# Patient Record
Sex: Female | Born: 1935 | Race: Black or African American | Hispanic: No | Smoking: Former smoker
Health system: Southern US, Community
[De-identification: ages and names within clinical notes are randomized; demographics above are authoritative.]

## PROBLEM LIST (undated history)

## (undated) DIAGNOSIS — M1990 Unspecified osteoarthritis, unspecified site: Secondary | ICD-10-CM

## (undated) DIAGNOSIS — G629 Polyneuropathy, unspecified: Secondary | ICD-10-CM

## (undated) DIAGNOSIS — M7120 Synovial cyst of popliteal space [Baker], unspecified knee: Secondary | ICD-10-CM

## (undated) DIAGNOSIS — I639 Cerebral infarction, unspecified: Secondary | ICD-10-CM

## (undated) DIAGNOSIS — E039 Hypothyroidism, unspecified: Secondary | ICD-10-CM

## (undated) DIAGNOSIS — I10 Essential (primary) hypertension: Secondary | ICD-10-CM

## (undated) DIAGNOSIS — C801 Malignant (primary) neoplasm, unspecified: Secondary | ICD-10-CM

## (undated) DIAGNOSIS — M810 Age-related osteoporosis without current pathological fracture: Secondary | ICD-10-CM

## (undated) DIAGNOSIS — E7800 Pure hypercholesterolemia, unspecified: Secondary | ICD-10-CM

## (undated) DIAGNOSIS — J449 Chronic obstructive pulmonary disease, unspecified: Secondary | ICD-10-CM

## (undated) HISTORY — PX: DILATION AND CURETTAGE OF UTERUS: SHX78

## (undated) HISTORY — DX: Malignant (primary) neoplasm, unspecified: C80.1

## (undated) HISTORY — DX: Hypothyroidism, unspecified: E03.9

## (undated) HISTORY — DX: Age-related osteoporosis without current pathological fracture: M81.0

## (undated) HISTORY — DX: Essential (primary) hypertension: I10

## (undated) HISTORY — DX: Polyneuropathy, unspecified: G62.9

## (undated) HISTORY — DX: Chronic obstructive pulmonary disease, unspecified: J44.9

## (undated) HISTORY — DX: Pure hypercholesterolemia, unspecified: E78.00

## (undated) HISTORY — DX: Synovial cyst of popliteal space (Baker), unspecified knee: M71.20

## (undated) HISTORY — DX: Cerebral infarction, unspecified: I63.9

## (undated) HISTORY — DX: Unspecified osteoarthritis, unspecified site: M19.90

---

## 1997-03-07 DIAGNOSIS — I639 Cerebral infarction, unspecified: Secondary | ICD-10-CM

## 1997-03-07 HISTORY — DX: Cerebral infarction, unspecified: I63.9

## 2004-05-25 ENCOUNTER — Other Ambulatory Visit: Payer: Self-pay

## 2004-05-25 ENCOUNTER — Emergency Department: Payer: Self-pay | Admitting: Emergency Medicine

## 2004-09-23 ENCOUNTER — Emergency Department: Payer: Self-pay | Admitting: Emergency Medicine

## 2005-01-16 ENCOUNTER — Ambulatory Visit: Payer: Self-pay | Admitting: Internal Medicine

## 2007-01-20 ENCOUNTER — Other Ambulatory Visit: Payer: Self-pay

## 2007-01-20 ENCOUNTER — Emergency Department: Payer: Self-pay

## 2007-04-22 ENCOUNTER — Ambulatory Visit: Payer: Self-pay | Admitting: Emergency Medicine

## 2007-11-04 ENCOUNTER — Ambulatory Visit: Payer: Self-pay | Admitting: Internal Medicine

## 2008-04-04 ENCOUNTER — Ambulatory Visit: Payer: Self-pay | Admitting: Family Medicine

## 2009-04-24 ENCOUNTER — Ambulatory Visit: Payer: Self-pay | Admitting: Internal Medicine

## 2009-07-31 ENCOUNTER — Emergency Department: Payer: Self-pay | Admitting: Internal Medicine

## 2010-02-24 ENCOUNTER — Emergency Department: Payer: Self-pay | Admitting: Emergency Medicine

## 2011-05-17 ENCOUNTER — Ambulatory Visit: Payer: Self-pay | Admitting: Ophthalmology

## 2011-05-23 ENCOUNTER — Ambulatory Visit: Payer: Self-pay | Admitting: Ophthalmology

## 2012-07-17 ENCOUNTER — Other Ambulatory Visit: Payer: Self-pay | Admitting: Internal Medicine

## 2012-07-17 NOTE — Telephone Encounter (Signed)
Pt is needing refill on Levothyroxine and she uses [facility] in [place].

## 2012-07-17 NOTE — Telephone Encounter (Signed)
I am ok to refill her levothyroxine - will need to clarify dose with her pharmacy and then ok to call in until her appt with me.

## 2012-07-18 ENCOUNTER — Telehealth: Payer: Self-pay | Admitting: *Deleted

## 2012-07-18 MED ORDER — LEVOTHYROXINE SODIUM 50 MCG PO TABS: 50.0000 ug | ORAL_TABLET | Freq: Every day | ORAL | Status: DC

## 2012-07-18 NOTE — Telephone Encounter (Signed)
Called prescription into pharmacy. Patient notified.

## 2012-07-18 NOTE — Telephone Encounter (Signed)
Patient's date of birth is wrong, the correct date is 03/01/1931.

## 2012-08-16 ENCOUNTER — Other Ambulatory Visit: Payer: Self-pay | Admitting: Internal Medicine

## 2012-08-16 NOTE — Telephone Encounter (Signed)
[facility] is needing [facility]ills [facility]   Triamterene -HCTZ 37.5-25 mg  # 30  Vytorin 10-40 mg  # 30

## 2012-08-21 MED ORDER — EZETIMIBE-SIMVASTATIN 10-40 MG PO TABS: 1.0000 | ORAL_TABLET | Freq: Every day | ORAL | Status: DC

## 2012-08-21 MED ORDER — TRIAMTERENE-HCTZ 37.5-25 MG PO CAPS: 1.0000 | ORAL_CAPSULE | ORAL | Status: DC

## 2012-08-21 NOTE — Telephone Encounter (Signed)
Sent in to pharmacy.  

## 2012-09-17 ENCOUNTER — Encounter: Payer: Self-pay | Admitting: Internal Medicine

## 2012-09-25 ENCOUNTER — Ambulatory Visit (INDEPENDENT_AMBULATORY_CARE_PROVIDER_SITE_OTHER): Payer: Medicaid Other | Admitting: Internal Medicine

## 2012-09-25 ENCOUNTER — Encounter: Payer: Self-pay | Admitting: Internal Medicine

## 2012-09-25 VITALS — BP 130/70 | HR 86 | Temp 97.5°F | Ht 62.0 in | Wt 128.5 lb

## 2012-09-25 DIAGNOSIS — I6350 Cerebral infarction due to unspecified occlusion or stenosis of unspecified cerebral artery: Secondary | ICD-10-CM

## 2012-09-25 DIAGNOSIS — G629 Polyneuropathy, unspecified: Secondary | ICD-10-CM

## 2012-09-25 DIAGNOSIS — R55 Syncope and collapse: Secondary | ICD-10-CM

## 2012-09-25 DIAGNOSIS — E7800 Pure hypercholesterolemia, unspecified: Secondary | ICD-10-CM

## 2012-09-25 DIAGNOSIS — G609 Hereditary and idiopathic neuropathy, unspecified: Secondary | ICD-10-CM

## 2012-09-25 DIAGNOSIS — I639 Cerebral infarction, unspecified: Secondary | ICD-10-CM

## 2012-09-25 DIAGNOSIS — I10 Essential (primary) hypertension: Secondary | ICD-10-CM

## 2012-09-25 LAB — HEPATIC FUNCTION PANEL
ALT: 10 U/L (ref 0–35)
AST: 16 U/L (ref 0–37)
Alkaline Phosphatase: 60 U/L (ref 39–117)
Bilirubin, Direct: 0.1 mg/dL (ref 0.0–0.3)
Total Protein: 6.8 g/dL (ref 6.0–8.3)

## 2012-09-25 LAB — CBC WITH DIFFERENTIAL/PLATELET
Basophils Absolute: 0.1 10*3/uL (ref 0.0–0.1)
Eosinophils Absolute: 0.2 10*3/uL (ref 0.0–0.7)
Hemoglobin: 12.9 g/dL (ref 12.0–15.0)
Lymphocytes Relative: 22.5 % (ref 12.0–46.0)
Lymphs Abs: 2.8 10*3/uL (ref 0.7–4.0)
MCHC: 33.3 g/dL (ref 30.0–36.0)
Monocytes Absolute: 0.8 10*3/uL (ref 0.1–1.0)
Neutro Abs: 8.4 10*3/uL — ABNORMAL HIGH (ref 1.4–7.7)
RDW: 13.6 % (ref 11.5–14.6)

## 2012-09-25 LAB — BASIC METABOLIC PANEL
Calcium: 9.3 mg/dL (ref 8.4–10.5)
Chloride: 105 mEq/L (ref 96–112)
Creatinine, Ser: 0.9 mg/dL (ref 0.4–1.2)
GFR: 81.66 mL/min (ref >60.00)

## 2012-09-25 LAB — LIPID PANEL: Cholesterol: 146 mg/dL (ref 0–200)

## 2012-09-25 LAB — TSH: TSH: 1.16 u[IU]/mL (ref 0.35–5.50)

## 2012-09-26 ENCOUNTER — Other Ambulatory Visit: Payer: Self-pay | Admitting: Internal Medicine

## 2012-09-26 DIAGNOSIS — D72829 Elevated white blood cell count, unspecified: Secondary | ICD-10-CM

## 2012-09-26 NOTE — Progress Notes (Signed)
Order placed for follow up cbc 

## 2012-09-29 ENCOUNTER — Encounter: Payer: Self-pay | Admitting: Internal Medicine

## 2012-09-29 DIAGNOSIS — E7800 Pure hypercholesterolemia, unspecified: Secondary | ICD-10-CM | POA: Insufficient documentation

## 2012-09-29 DIAGNOSIS — I639 Cerebral infarction, unspecified: Secondary | ICD-10-CM | POA: Insufficient documentation

## 2012-09-29 DIAGNOSIS — I10 Essential (primary) hypertension: Secondary | ICD-10-CM | POA: Insufficient documentation

## 2012-09-29 DIAGNOSIS — G629 Polyneuropathy, unspecified: Secondary | ICD-10-CM | POA: Insufficient documentation

## 2012-09-29 DIAGNOSIS — R55 Syncope and collapse: Secondary | ICD-10-CM | POA: Insufficient documentation

## 2012-09-29 NOTE — Assessment & Plan Note (Signed)
She remains on aspirin daily.  Has done well.  Had this episode one month ago.  Appears to have had no focal neurological changes.  Discussed further w/up including scanning and carotid ultrasound.  She declines.  Wants to monitor.  Will be evaluated if reoccurs.  Follow.

## 2012-09-29 NOTE — Assessment & Plan Note (Addendum)
Not taking Gabapentin now.  Did not report as a significant problem today.  Follow.  Consider checking B12 if has not been checked.  Review records.

## 2012-09-29 NOTE — Assessment & Plan Note (Signed)
Blood pressure is doing well on no medication.  Remain off.  Check metabolic panel.  Stop potassium.  Unclear if the episode was related to the medication/blood pressure drop.  Has not occurred since stopping.  Will remain off.  Follow pressures.  Pt desires to hold on any further w/up currently.  Follow.

## 2012-09-29 NOTE — Assessment & Plan Note (Signed)
Had the one episode as outlined.  Has not reoccurred since stopping the medication.  Blood pressure looks good.  Discussed further w/up.  She declines.  Wants to monitor.  Will notify me if she changes her mind.  Will be evaluated if occurs again.

## 2012-09-29 NOTE — Assessment & Plan Note (Signed)
Remains on vytorin.  Has eaten today.  Will check cholesterol with next fasting lab. Check liver function today.

## 2012-09-29 NOTE — Progress Notes (Signed)
Subjective:    Patient ID: Rhonda Morse, female    DOB: July 21, 1931, 79 y.o.   MRN: [id number]  HPI 79 year old female with past history of hypertension, hypercholesterolemia and previous CVA.  She comes in today for a scheduled follow up.  She is accompanied by her daughter Burna Mortimer.  History obtained from both of them.  States about one month ago, she got up out of bed and head felt funny.   Episode was not witnessed.  States she went down on the ground.  No head injury.  Burna Mortimer saw her soon after the episode.  States she was not confused.  Was oriented.  Got up.  Has had no further episodes.  She reports that she got a new blood pressure medication and had just started.  After this episode, she stopped the medication and has had no further dizziness or light headedness.  No syncope or near syncope.  No chest pain or tightness.  No increased sob.  Eating and drinking ok.  Weight stable.  Bowels stable.     Past Medical History  Diagnosis Date  . Hypertension     Remote history of hypertension   . Hypercholesteremia   . CVA (cerebral infarction) 08-98    History of CVA in   . Hypothyroidism   . Osteoarthritis   . COPD (chronic obstructive pulmonary disease)     COPD/fibrosis  . Osteoporosis   . Peripheral neuropathy   . Baker's cyst of knee     Behind the right knee    Current Outpatient Prescriptions on File Prior to Visit  Medication Sig Dispense Refill  . aspirin 81 MG tablet Take 81 mg by mouth daily. 1 tablet by mouth once a day      . ezetimibe-simvastatin (VYTORIN) 10-40 MG per tablet Take 1 tablet by mouth at bedtime.  30 tablet  1  . fexofenadine (ALLEGRA) 60 MG tablet Take 60 mg by mouth daily. Take 1 tablet by mouth twice a day      . levothyroxine (SYNTHROID, LEVOTHROID) 50 MCG tablet Take 1 tablet (50 mcg total) by mouth daily.  90 tablet  0  . potassium chloride (MICRO-K) 10 MEQ CR capsule Take 10 mEq by mouth 2 (two) times daily. Take 1 capsule by mouth once a day       No  current facility-administered medications on file prior to visit.    Review of Systems Patient denies any headache, lightheadedness or dizziness now.  Had the one episode.  Stopped the medication.  Resolved.  No chest pain, tightness or palpitations.  No increased shortness of breath, cough or congestion.  No nausea or vomiting.  No acid reflux.  No abdominal pain or cramping.  No bowel change, such as diarrhea, constipation, BRBPR or melana.  No urine change.        Objective:   Physical Exam Filed Vitals:   09/25/12 1034  BP: 130/70  Pulse: 86  Temp: 97.5 F (36.4 C)   Blood pressure recheck:  124/74, pulse 36  79 year old female in no acute distress.   HEENT:  Nares- clear.  Oropharynx - without lesions. NECK:  Supple.  Nontender.  No audible bruit.  HEART:  Appears to be regular. LUNGS:  No crackles or wheezing audible.  Respirations even and unlabored.  RADIAL PULSE:  Equal bilaterally.   ABDOMEN:  Soft, nontender.  Bowel sounds present and normal.  No audible abdominal bruit.   EXTREMITIES:  No increased edema present.  DP pulses palpable and equal bilaterally.          Assessment & Plan:  CARDIOVASCULAR.  She did have the apparent syncopal episode as outlined.  No chest pain or tightness.  No sob.  Discussed further cardiac w/up.  She declines.  Wants to monitor for now.  Follow.    PREVIOUS WEIGHT LOSS.  Weight is stable.  Follow.

## 2012-10-09 ENCOUNTER — Telehealth: Payer: Self-pay | Admitting: *Deleted

## 2012-10-09 ENCOUNTER — Emergency Department: Payer: Self-pay

## 2012-10-09 ENCOUNTER — Other Ambulatory Visit (INDEPENDENT_AMBULATORY_CARE_PROVIDER_SITE_OTHER): Payer: Medicaid Other

## 2012-10-09 DIAGNOSIS — D72829 Elevated white blood cell count, unspecified: Secondary | ICD-10-CM

## 2012-10-09 LAB — CBC WITH DIFFERENTIAL/PLATELET
Basophils Absolute: 0 10*3/uL (ref 0.0–0.1)
Eosinophils Absolute: 0.1 10*3/uL (ref 0.0–0.7)
Lymphocytes Relative: 11.8 % — ABNORMAL LOW (ref 12.0–46.0)
MCHC: 33.7 g/dL (ref 30.0–36.0)
MCV: 91.5 fl (ref 78.0–100.0)
Monocytes Absolute: 2.2 10*3/uL — ABNORMAL HIGH (ref 0.1–1.0)
Neutrophils Relative %: 73.2 % (ref 43.0–77.0)
RDW: 14.1 % (ref 11.5–14.6)

## 2012-10-09 LAB — COMPREHENSIVE METABOLIC PANEL
Albumin: 3.4 g/dL (ref 3.4–5.0)
Alkaline Phosphatase: 75 U/L (ref 50–136)
Chloride: 104 mmol/L (ref 98–107)
Co2: 29 mmol/L (ref 21–32)
Creatinine: 0.98 mg/dL (ref 0.60–1.30)
Potassium: 3.2 mmol/L — ABNORMAL LOW (ref 3.5–5.1)
SGOT(AST): 20 U/L (ref 15–37)
SGPT (ALT): 14 U/L (ref 12–78)
Sodium: 140 mmol/L (ref 136–145)

## 2012-10-09 LAB — TROPONIN I: Troponin-I: <0.02 ng/mL

## 2012-10-09 LAB — CBC
HCT: 39.9 % (ref 35.0–47.0)
HGB: 13.2 g/dL (ref 12.0–16.0)
MCHC: 33.1 g/dL (ref 32.0–36.0)
MCV: 92 fL (ref 80–100)
Platelet: 353 10*3/uL (ref 150–440)
RBC: 4.34 10*6/uL (ref 3.80–5.20)
RDW: 14.1 % (ref 11.5–14.5)

## 2012-10-09 LAB — LIPASE, BLOOD: Lipase: 129 U/L (ref 73–393)

## 2012-10-09 LAB — URINALYSIS, COMPLETE
Glucose,UR: NEGATIVE mg/dL (ref 0–75)
Nitrite: NEGATIVE
RBC,UR: 55 /HPF (ref 0–5)
Specific Gravity: 1.02 (ref 1.003–1.030)
Squamous Epithelial: 7
WBC UR: 15 /HPF (ref 0–5)

## 2012-10-09 NOTE — Telephone Encounter (Signed)
Patient came in to office today with acute symptoms. Patient was experiencing dizziness, vomiting, uncontrollable bowel movements, coughing up a lot of fleam, and feeling off balance. Patient was advised per Dr. Lorin Picket to go to ER for evaluation and follow-up with [place] after.

## 2012-10-11 LAB — URINE CULTURE

## 2012-10-11 LAB — COMPREHENSIVE METABOLIC PANEL
BUN: 19 mg/dL — ABNORMAL HIGH (ref 7–18)
Co2: 29 mmol/L (ref 21–32)
Creatinine: 1.07 mg/dL (ref 0.60–1.30)
EGFR (African American): 57 — ABNORMAL LOW
EGFR (Non-African Amer.): 49 — ABNORMAL LOW
Osmolality: 277 (ref 275–301)
SGPT (ALT): 27 U/L (ref 12–78)
Sodium: 137 mmol/L (ref 136–145)
Total Protein: 7.9 g/dL (ref 6.4–8.2)

## 2012-10-11 LAB — CK TOTAL AND CKMB (NOT AT ARMC): CK-MB: 29.2 ng/mL — ABNORMAL HIGH (ref 0.5–3.6)

## 2012-10-11 LAB — CBC
HGB: 14.1 g/dL (ref 12.0–16.0)
WBC: 14.9 10*3/uL — ABNORMAL HIGH (ref 3.6–11.0)

## 2012-10-11 LAB — TROPONIN I: Troponin-I: 0.04 ng/mL

## 2012-10-12 ENCOUNTER — Inpatient Hospital Stay: Payer: Self-pay | Admitting: Internal Medicine

## 2012-10-14 ENCOUNTER — Telehealth: Payer: Self-pay | Admitting: *Deleted

## 2012-10-14 MED ORDER — LEVOTHYROXINE SODIUM 50 MCG PO TABS: 50.0000 ug | ORAL_TABLET | Freq: Every day | ORAL | Status: DC

## 2012-10-14 NOTE — Telephone Encounter (Signed)
Returned patient call. Spoke with her daughter and she requested a refill for mother's Synthroid 50 mcg;script filled for 3 months no refills

## 2012-10-15 ENCOUNTER — Telehealth: Payer: Self-pay | Admitting: Internal Medicine

## 2012-10-15 NOTE — Telephone Encounter (Signed)
Patient has hospital follow up set up for 3.17.14 @ 11:15

## 2012-10-16 NOTE — Telephone Encounter (Signed)
Hospital records requested for f/u

## 2012-10-17 LAB — CULTURE, BLOOD (SINGLE)

## 2012-10-21 ENCOUNTER — Ambulatory Visit: Payer: Self-pay | Admitting: Adult Health

## 2012-10-31 ENCOUNTER — Encounter: Payer: Self-pay | Admitting: Adult Health

## 2012-10-31 ENCOUNTER — Ambulatory Visit (INDEPENDENT_AMBULATORY_CARE_PROVIDER_SITE_OTHER): Payer: Medicare Other | Admitting: Adult Health

## 2012-10-31 VITALS — BP 142/87 | HR 98 | Temp 97.9°F | Ht 62.0 in | Wt 126.0 lb

## 2012-10-31 DIAGNOSIS — Z09 Encounter for follow-up examination after completed treatment for conditions other than malignant neoplasm: Secondary | ICD-10-CM

## 2012-10-31 DIAGNOSIS — R634 Abnormal weight loss: Secondary | ICD-10-CM | POA: Insufficient documentation

## 2012-10-31 MED ORDER — EZETIMIBE-SIMVASTATIN 10-40 MG PO TABS: 1.0000 | ORAL_TABLET | Freq: Every day | ORAL | Status: DC

## 2012-10-31 MED ORDER — FLUTICASONE-SALMETEROL 250-50 MCG/DOSE IN AEPB: 1.0000 | INHALATION_SPRAY | Freq: Two times a day (BID) | RESPIRATORY_TRACT | Status: AC

## 2012-10-31 MED ORDER — TIOTROPIUM BROMIDE MONOHYDRATE 18 MCG IN CAPS: 18.0000 ug | ORAL_CAPSULE | Freq: Every day | RESPIRATORY_TRACT | Status: AC

## 2012-10-31 MED ORDER — ALBUTEROL SULFATE HFA 108 (90 BASE) MCG/ACT IN AERS: 2.0000 | INHALATION_SPRAY | Freq: Four times a day (QID) | RESPIRATORY_TRACT | Status: AC

## 2012-10-31 MED ORDER — LEVOTHYROXINE SODIUM 50 MCG PO TABS: 50.0000 ug | ORAL_TABLET | Freq: Every day | ORAL | Status: DC

## 2012-10-31 MED ORDER — POTASSIUM CHLORIDE ER 10 MEQ PO CPCR: 10.0000 meq | ORAL_CAPSULE | Freq: Every day | ORAL | Status: AC

## 2012-10-31 NOTE — Assessment & Plan Note (Signed)
Patient was recently discharged from [facility] on 10/12/12 for COPD exacerbation. Overall, she is doing well. Some fatigue and weakness associated with recent illness. I am checking her potassium and kidney function given that changes were made to her medications during hospitalization. Patient has a followup appointment with her PCP in April. Return to clinic sooner if needed

## 2012-10-31 NOTE — Progress Notes (Signed)
  Subjective:    Patient ID: Rhonda Morse, female    DOB: 17-Oct-1930, 79 y.o.   MRN: [id number]  HPI  Patient is a pleasant 79 year old female with history of CVA, COPD, HTN, hyperlipidemia and hypothyroidism who presents to clinic for hospital follow up. She first presented to [facility] emergency room on 10/09/12 with shortness of breath. She was discharged back home with prednisone taper, antibiotic and inhalers. She returned to the emergency room 2 days later with increasing shortness of breath and hospitalized from 10/11/12 through 10/12/12 for COPD exacerbation. According to hospital discharge summary, patient signed herself out AMA. The hospitalists actually wanted patient to stay another 24 hours.  She is here in clinic today with her daughter. Patient is feeling overall improved. She has been started on Spiriva, Advair, albuterol inhaler. She is also on potassium, levothyroxine, aspirin and Vytorin. Medication changes have been reflected in her chart.   Current Outpatient Prescriptions on File Prior to Visit  Medication Sig Dispense Refill  . aspirin 81 MG tablet Take 81 mg by mouth daily. 1 tablet by mouth once a day      . brimonidine-timolol (COMBIGAN) 0.2-0.5 % ophthalmic solution Place 1 drop into both eyes every 12 (twelve) hours.      . fexofenadine (ALLEGRA) 60 MG tablet Take 60 mg by mouth daily. Take 1 tablet by mouth twice a day       No current facility-administered medications on file prior to visit.    Review of Systems  Constitutional: Positive for fatigue. Negative for fever, chills and appetite change.  Respiratory: Negative for cough, chest tightness, shortness of breath and wheezing.   Cardiovascular: Negative for chest pain and palpitations.  Gastrointestinal: Negative for abdominal distention.  Genitourinary: Negative.   Neurological: Positive for weakness. Negative for dizziness, light-headedness and headaches.       Right sided weakness 2/2 CVA  Psychiatric/Behavioral:  Negative.        Objective:   Physical Exam  Constitutional: She is oriented to person, place, and time. She appears well-developed and well-nourished. No distress.  Cardiovascular: Normal rate, regular rhythm and normal heart sounds.  Exam reveals no gallop.   No murmur heard. Pulmonary/Chest: Effort normal and breath sounds normal. No respiratory distress. She has no wheezes. She has no rales.  Abdominal: Soft. Bowel sounds are normal.  Musculoskeletal: She exhibits no edema and no tenderness.  Right sided weakness. Ambulates with cane. Hx of CVA  Neurological: She is alert and oriented to person, place, and time.  Right sided hemiparesis 2/2 CVA  Skin: Skin is warm and dry.  Psychiatric: She has a normal mood and affect. Her behavior is normal. Thought content normal.          Assessment & Plan:

## 2012-11-01 LAB — COMPREHENSIVE METABOLIC PANEL
AST: 14 U/L (ref 0–37)
Albumin: 3.2 g/dL — ABNORMAL LOW (ref 3.5–5.2)
Alkaline Phosphatase: 61 U/L (ref 39–117)
BUN: 8 mg/dL (ref 6–23)
Potassium: 3.7 mEq/L (ref 3.5–5.1)
Sodium: 137 mEq/L (ref 135–145)
Total Bilirubin: 0.5 mg/dL (ref 0.3–1.2)
Total Protein: 6.5 g/dL (ref 6.0–8.3)

## 2012-11-04 ENCOUNTER — Encounter: Payer: Self-pay | Admitting: Internal Medicine

## 2012-11-04 ENCOUNTER — Encounter: Payer: Self-pay | Admitting: General Practice

## 2012-11-04 ENCOUNTER — Other Ambulatory Visit: Payer: Self-pay | Admitting: Internal Medicine

## 2012-11-06 NOTE — Telephone Encounter (Signed)
Rx sent in to pharmacy. 

## 2012-11-14 NOTE — Telephone Encounter (Signed)
Please close encounter

## 2012-12-24 ENCOUNTER — Encounter: Payer: Self-pay | Admitting: Internal Medicine

## 2012-12-24 ENCOUNTER — Ambulatory Visit (INDEPENDENT_AMBULATORY_CARE_PROVIDER_SITE_OTHER): Payer: Medicare Other | Admitting: Internal Medicine

## 2012-12-24 VITALS — BP 124/72 | HR 66 | Temp 97.8°F | Ht 62.0 in | Wt 113.5 lb

## 2012-12-24 DIAGNOSIS — E7800 Pure hypercholesterolemia, unspecified: Secondary | ICD-10-CM

## 2012-12-24 DIAGNOSIS — I10 Essential (primary) hypertension: Secondary | ICD-10-CM

## 2012-12-24 DIAGNOSIS — R55 Syncope and collapse: Secondary | ICD-10-CM

## 2012-12-24 DIAGNOSIS — I639 Cerebral infarction, unspecified: Secondary | ICD-10-CM

## 2012-12-24 DIAGNOSIS — I6350 Cerebral infarction due to unspecified occlusion or stenosis of unspecified cerebral artery: Secondary | ICD-10-CM

## 2012-12-24 DIAGNOSIS — R634 Abnormal weight loss: Secondary | ICD-10-CM

## 2012-12-24 LAB — CBC WITH DIFFERENTIAL/PLATELET
Basophils Relative: 1.2 % (ref 0.0–3.0)
Eosinophils Relative: 0.8 % (ref 0.0–5.0)
Hemoglobin: 12.1 g/dL (ref 12.0–15.0)
Lymphocytes Relative: 17.5 % (ref 12.0–46.0)
Monocytes Relative: 7.6 % (ref 3.0–12.0)
Neutro Abs: 9.8 10*3/uL — ABNORMAL HIGH (ref 1.4–7.7)
RBC: 4.09 Mil/uL (ref 3.87–5.11)

## 2012-12-24 LAB — HEPATIC FUNCTION PANEL
ALT: 10 U/L (ref 0–35)
AST: 18 U/L (ref 0–37)
Alkaline Phosphatase: 69 U/L (ref 39–117)
Bilirubin, Direct: 0.1 mg/dL (ref 0.0–0.3)
Total Bilirubin: 0.7 mg/dL (ref 0.3–1.2)

## 2012-12-25 ENCOUNTER — Ambulatory Visit: Payer: Medicare Other

## 2012-12-25 DIAGNOSIS — R634 Abnormal weight loss: Secondary | ICD-10-CM

## 2012-12-25 LAB — BASIC METABOLIC PANEL
BUN: 9 mg/dL (ref 6–23)
Calcium: 8.4 mg/dL (ref 8.4–10.5)
GFR: 91.34 mL/min (ref >60.00)
Glucose, Bld: 70 mg/dL (ref 70–99)

## 2012-12-29 ENCOUNTER — Encounter: Payer: Self-pay | Admitting: Internal Medicine

## 2012-12-29 NOTE — Assessment & Plan Note (Signed)
She remains on aspirin daily.  Has done well.  Had this episode as outlined in last note.   Appears to have had no focal neurological changes.  Discussed further w/up including scanning and carotid ultrasound.  She declined.  Wanted to monitor.  Will be evaluated if reoccurs.  Follow.

## 2012-12-29 NOTE — Assessment & Plan Note (Signed)
Blood pressure is doing well on no medication.  Remain off.  Check metabolic panel.  Off potassium.

## 2012-12-29 NOTE — Assessment & Plan Note (Signed)
Remains on vytorin.  Has eaten today.  Will check cholesterol with next fasting lab. Check liver function today.

## 2012-12-29 NOTE — Progress Notes (Signed)
Subjective:    Patient ID: Rhonda Morse, female    DOB: 07-20-1931, 79 y.o.   MRN: [id number]  HPI 79 year old female with past history of hypertension, hypercholesterolemia and previous CVA.  She comes in today for a scheduled follow up.  She is accompanied by her daughter Burna Mortimer.  History obtained from both of them.  Has had some issues with allergies recently.  Runny nose and sneezing. No significant sinus pressure.  No increased cough or congestion.  She did have some previous abdominal pain.  Some loose stool.  No blood.  Still eating some.  Has lost weight.  Some fatigue.  Taking her medication, except for her potassium.  Not taking this and has not been on it for a while.      Past Medical History  Diagnosis Date  . Hypertension     Remote history of hypertension   . Hypercholesteremia   . CVA (cerebral infarction) 08-98    History of CVA in   . Hypothyroidism   . Osteoarthritis   . COPD (chronic obstructive pulmonary disease)     COPD/fibrosis  . Osteoporosis   . Peripheral neuropathy   . Baker's cyst of knee     Behind the right knee    Current Outpatient Prescriptions on File Prior to Visit  Medication Sig Dispense Refill  . albuterol (PROAIR HFA) 108 (90 BASE) MCG/ACT inhaler Inhale 2 puffs into the lungs 4 (four) times daily.  8.5 g  3  . aspirin 81 MG tablet Take 81 mg by mouth daily. 1 tablet by mouth once a day      . brimonidine-timolol (COMBIGAN) 0.2-0.5 % ophthalmic solution Place 1 drop into both eyes every 12 (twelve) hours.      Marland Kitchen ezetimibe-simvastatin (VYTORIN) 10-40 MG per tablet Take 1 tablet by mouth at bedtime.  30 tablet  3  . ezetimibe-simvastatin (VYTORIN) 10-40 MG per tablet Take 1 tablet by mouth at bedtime.  30 tablet  5  . fexofenadine (ALLEGRA) 60 MG tablet Take 60 mg by mouth daily. Take 1 tablet by mouth twice a day      . Fluticasone-Salmeterol (ADVAIR) 250-50 MCG/DOSE AEPB Inhale 1 puff into the lungs 2 (two) times daily.  60 each  2  .  levothyroxine (SYNTHROID, LEVOTHROID) 50 MCG tablet Take 1 tablet (50 mcg total) by mouth daily.  30 tablet  3  . potassium chloride (MICRO-K) 10 MEQ CR capsule Take 1 capsule (10 mEq total) by mouth daily. Take 1 capsule by mouth once a day  30 capsule  3  . tiotropium (SPIRIVA) 18 MCG inhalation capsule Place 1 capsule (18 mcg total) into inhaler and inhale daily.  30 capsule  3   No current facility-administered medications on file prior to visit.    Review of Systems Patient denies any headache, lightheadedness or dizziness now.  Had the one episode as outlined in previous note.  Stopped the medication.  Resolved.  Has not had any reoccurrence.  No chest pain, tightness or palpitations.  No increased shortness of breath, cough or congestion.  No nausea or vomiting.  No acid reflux.  Some intermittent abdominal pain.  Some loose stool.  No BRBPR or melana.  No urine change.   Weight loss.      Objective:   Physical Exam  Filed Vitals:   12/24/12 1017  BP: 124/72  Pulse: 66  Temp: 97.8 F (36.6 C)   Pulse 60  79 year old female in no acute  distress.   HEENT:  Nares- clear.  Oropharynx - without lesions. NECK:  Supple.  Nontender.  No audible bruit.  HEART:  Appears to be regular. LUNGS:  No crackles or wheezing audible.  Respirations even and unlabored.  RADIAL PULSE:  Equal bilaterally.   ABDOMEN:  Soft.  No significant tenderness to palpation.   Bowel sounds present and normal.  No audible abdominal bruit.   EXTREMITIES:  No increased edema present.  DP pulses palpable and equal bilaterally.          Assessment & Plan:  CARDIOVASCULAR.  She did have the apparent syncopal episode as outlined in last note.  No chest pain or tightness.  No sob.  Discussed further cardiac w/up.  She declined.  Wanted to monitor. Follow.  Has not had any reoccurrence.

## 2012-12-29 NOTE — Assessment & Plan Note (Signed)
Intermittent abdominal discomfort.  Bowel change.  Weight loss.  Check cbc, met c and tsh.  Consider CT scan to further evaluate.  Follow.  Get her back in soon to reassess.

## 2012-12-29 NOTE — Assessment & Plan Note (Signed)
Had the one episode as outlined in last note.  Has not reoccurred since stopping the medication.  Blood pressure looks good.  Discussed further w/up.  She declined.  Wanted to monitor.  Will notify me if she changes her mind.  Will be evaluated if occurs again.

## 2012-12-30 ENCOUNTER — Telehealth: Payer: Self-pay | Admitting: Internal Medicine

## 2012-12-30 DIAGNOSIS — R634 Abnormal weight loss: Secondary | ICD-10-CM

## 2012-12-30 DIAGNOSIS — D72829 Elevated white blood cell count, unspecified: Secondary | ICD-10-CM

## 2012-12-30 DIAGNOSIS — R630 Anorexia: Secondary | ICD-10-CM

## 2012-12-30 NOTE — Telephone Encounter (Signed)
Pts daughter Burna Mortimer notified of lab results and need for f/u regarding the wbc count.  Also given the persistent weight loss, will schedule CT  abdomen and pelvis.  Order placed.  Had chest CT her 3/14 hospital stay.

## 2013-01-07 ENCOUNTER — Ambulatory Visit (INDEPENDENT_AMBULATORY_CARE_PROVIDER_SITE_OTHER): Payer: Medicare Other | Admitting: Internal Medicine

## 2013-01-07 ENCOUNTER — Other Ambulatory Visit: Payer: Self-pay | Admitting: *Deleted

## 2013-01-07 ENCOUNTER — Encounter: Payer: Self-pay | Admitting: Internal Medicine

## 2013-01-07 VITALS — BP 110/80 | HR 86 | Temp 97.7°F | Ht 62.0 in | Wt 111.0 lb

## 2013-01-07 DIAGNOSIS — R634 Abnormal weight loss: Secondary | ICD-10-CM

## 2013-01-07 DIAGNOSIS — I10 Essential (primary) hypertension: Secondary | ICD-10-CM

## 2013-01-07 DIAGNOSIS — G629 Polyneuropathy, unspecified: Secondary | ICD-10-CM

## 2013-01-07 DIAGNOSIS — G609 Hereditary and idiopathic neuropathy, unspecified: Secondary | ICD-10-CM

## 2013-01-07 DIAGNOSIS — R55 Syncope and collapse: Secondary | ICD-10-CM

## 2013-01-07 DIAGNOSIS — I639 Cerebral infarction, unspecified: Secondary | ICD-10-CM

## 2013-01-07 DIAGNOSIS — I6350 Cerebral infarction due to unspecified occlusion or stenosis of unspecified cerebral artery: Secondary | ICD-10-CM

## 2013-01-07 MED ORDER — FEXOFENADINE HCL 60 MG PO TABS: 60.0000 mg | ORAL_TABLET | Freq: Every day | ORAL | Status: AC

## 2013-01-09 ENCOUNTER — Encounter: Payer: Self-pay | Admitting: Internal Medicine

## 2013-01-09 ENCOUNTER — Telehealth: Payer: Self-pay | Admitting: *Deleted

## 2013-01-09 NOTE — Assessment & Plan Note (Signed)
Had the one episode as outlined in a previous note.  Has not reoccurred since stopping the medication.  Blood pressure looks good.  Discussed further w/up.  She declined.  Wanted to monitor.  Will notify me if she changes her mind.  Will be evaluated if occurs again.

## 2013-01-09 NOTE — Progress Notes (Signed)
Subjective:    Patient ID: Rhonda Morse, female    DOB: January 11, 1931, 79 y.o.   MRN: [id number]  HPI 79 year old female with past history of hypertension, hypercholesterolemia and previous CVA.  She comes in today for a scheduled follow up.  She is accompanied by her daughter Burna Mortimer.  History obtained from both of them. No increased cough or congestion. Breathing stable.  She does report continue decreased appetite.  Some nausea.  Some abdominal pain.  Continued weight loss.  Some depression related to not eating.  No vomiting.     Past Medical History  Diagnosis Date  . Hypertension     Remote history of hypertension   . Hypercholesteremia   . CVA (cerebral infarction) 08-98    History of CVA in   . Hypothyroidism   . Osteoarthritis   . COPD (chronic obstructive pulmonary disease)     COPD/fibrosis  . Osteoporosis   . Peripheral neuropathy   . Baker's cyst of knee     Behind the right knee    Current Outpatient Prescriptions on File Prior to Visit  Medication Sig Dispense Refill  . albuterol (PROAIR HFA) 108 (90 BASE) MCG/ACT inhaler Inhale 2 puffs into the lungs 4 (four) times daily.  8.5 g  3  . aspirin 81 MG tablet Take 81 mg by mouth daily. 1 tablet by mouth once a day      . brimonidine-timolol (COMBIGAN) 0.2-0.5 % ophthalmic solution Place 1 drop into both eyes every 12 (twelve) hours.      Marland Kitchen ezetimibe-simvastatin (VYTORIN) 10-40 MG per tablet Take 1 tablet by mouth at bedtime.  30 tablet  5  . Fluticasone-Salmeterol (ADVAIR) 250-50 MCG/DOSE AEPB Inhale 1 puff into the lungs 2 (two) times daily.  60 each  2  . levothyroxine (SYNTHROID, LEVOTHROID) 50 MCG tablet Take 1 tablet (50 mcg total) by mouth daily.  30 tablet  3  . potassium chloride (MICRO-K) 10 MEQ CR capsule Take 1 capsule (10 mEq total) by mouth daily. Take 1 capsule by mouth once a day  30 capsule  3  . tiotropium (SPIRIVA) 18 MCG inhalation capsule Place 1 capsule (18 mcg total) into inhaler and inhale daily.  30  capsule  3   No current facility-administered medications on file prior to visit.    Review of Systems Patient denies any headache, lightheadedness or dizziness now.  Had the one episode as outlined in previous note.  Stopped the medication.  Resolved.  Has not had any reoccurrence.  No chest pain, tightness or palpitations.  No increased shortness of breath, cough or congestion.  Some decreased appetite and nausea.  No vomiting.  No acid reflux.  Some intermittent abdominal pain.  No BRBPR or melana.  No urine change.   Weight loss.      Objective:   Physical Exam  Filed Vitals:   01/07/13 1540  BP: 110/80  Pulse: 86  Temp: 97.7 F (36.5 C)   Pulse 1  79 year old female in no acute distress.   HEENT:  Nares- clear.  Oropharynx - without lesions. NECK:  Supple.  Nontender.  No audible bruit.  HEART:  Appears to be regular. LUNGS:  No crackles or wheezing audible.  Respirations even and unlabored.  RADIAL PULSE:  Equal bilaterally.   ABDOMEN:  Soft.  Tenderness to palpation over the lower abdomen and pelvic region.   Bowel sounds present and normal.  No audible abdominal bruit.  Increased fullness (questionable mass) noted on  palpation of her lower abdomen and pelvis.  EXTREMITIES:  No increased edema present.  DP pulses palpable and equal bilaterally.          Assessment & Plan:  CARDIOVASCULAR.  She did have the apparent syncopal episode as outlined in the previous note.  No chest pain or tightness.  No sob.  Discussed further cardiac w/up.  She declined.  Wanted to monitor.  Follow.  Has not had any reoccurrence.

## 2013-01-09 NOTE — Assessment & Plan Note (Signed)
Blood pressure is doing well on no medication.  Remain off.  Off potassium.

## 2013-01-09 NOTE — Assessment & Plan Note (Signed)
Continues.  Continues to have decreased appetite and nausea.  Exam with what appears to be abdominal fullness and tenderness.  Questionable mass.  Have ordered CT abdomen and pelvis.  Insurance initially denies.  Have appealed.  Waiting for their decision regarding my appeal.  Encourage increased po intake.  Further w/up pending results of scan.

## 2013-01-09 NOTE — Assessment & Plan Note (Signed)
She remains on aspirin daily.  Has done well.  Unsure of what the episode was this weekend with her vision.  Her daughter states she has not been using her drops as she should.  No other neurological deficits.  Pt declines any change in vision.  Has cataract.  Continue daily aspirin.  Will follow.

## 2013-01-09 NOTE — Assessment & Plan Note (Signed)
Not taking Gabapentin now.  Did not report as a significant problem today.  Follow.  Consider checking B12 if has not been checked.  Review records.

## 2013-01-09 NOTE — Telephone Encounter (Signed)
Received a call from a Dr. Broadus John (stated that he spoke with Dr. Lorin Picket last night) with an auth# for Rhonda Morse- [phone]. Amber aware

## 2013-01-13 ENCOUNTER — Telehealth: Payer: Self-pay | Admitting: Internal Medicine

## 2013-01-13 NOTE — Telephone Encounter (Signed)
Doesn't she need to pick up the prep at the hospital.  Can you let Burna Mortimer know.  Thanks.

## 2013-01-13 NOTE — Telephone Encounter (Signed)
Rhonda Morse is aware of the process. She came by the office. She had left me a VM on Friday and I wasn't here, everything is a go for tomorrow!

## 2013-01-13 NOTE — Telephone Encounter (Signed)
Patient daughter Rhonda Morse called left a message stating she would like to know if Dr. Lorin Picket called in the prep for her procedure scheduled for tomorrow. If she needs to pick that up from the pharmacy, please call her and let her know.

## 2013-01-13 NOTE — Telephone Encounter (Signed)
Thank you :)

## 2013-01-14 ENCOUNTER — Encounter: Payer: Self-pay | Admitting: Internal Medicine

## 2013-01-14 ENCOUNTER — Other Ambulatory Visit: Payer: Self-pay | Admitting: Internal Medicine

## 2013-01-14 ENCOUNTER — Other Ambulatory Visit: Payer: Self-pay | Admitting: *Deleted

## 2013-01-14 ENCOUNTER — Ambulatory Visit: Payer: Self-pay | Admitting: Internal Medicine

## 2013-01-14 MED ORDER — LEVOTHYROXINE SODIUM 50 MCG PO TABS: 50.0000 ug | ORAL_TABLET | Freq: Every day | ORAL | Status: AC

## 2013-01-20 ENCOUNTER — Ambulatory Visit: Payer: Self-pay | Admitting: Oncology

## 2013-01-21 ENCOUNTER — Telehealth: Payer: Self-pay | Admitting: Internal Medicine

## 2013-01-21 ENCOUNTER — Ambulatory Visit: Payer: Self-pay | Admitting: Oncology

## 2013-01-21 LAB — COMPREHENSIVE METABOLIC PANEL
Albumin: 2.2 g/dL — ABNORMAL LOW (ref 3.4–5.0)
Alkaline Phosphatase: 95 U/L (ref 50–136)
Calcium, Total: 8.6 mg/dL (ref 8.5–10.1)
Chloride: 104 mmol/L (ref 98–107)
Co2: 30 mmol/L (ref 21–32)
Creatinine: 0.94 mg/dL (ref 0.60–1.30)
EGFR (African American): >60
EGFR (Non-African Amer.): 57 — ABNORMAL LOW
Glucose: 76 mg/dL (ref 65–99)
Osmolality: 276 (ref 275–301)
Potassium: 3 mmol/L — ABNORMAL LOW (ref 3.5–5.1)
SGOT(AST): 20 U/L (ref 15–37)
Sodium: 139 mmol/L (ref 136–145)
Total Protein: 6.3 g/dL — ABNORMAL LOW (ref 6.4–8.2)

## 2013-01-21 LAB — CBC CANCER CENTER
Basophil #: 0.2 x10 3/mm — ABNORMAL HIGH (ref 0.0–0.1)
Basophil %: 1.4 %
Eosinophil #: 0.1 x10 3/mm (ref 0.0–0.7)
HCT: 35.6 % (ref 35.0–47.0)
HGB: 11.8 g/dL — ABNORMAL LOW (ref 12.0–16.0)
Lymphocyte #: 2.6 x10 3/mm (ref 1.0–3.6)
Lymphocyte %: 23.3 %
MCH: 29.4 pg (ref 26.0–34.0)
MCHC: 33.1 g/dL (ref 32.0–36.0)
MCV: 89 fL (ref 80–100)
Monocyte #: 1 x10 3/mm — ABNORMAL HIGH (ref 0.2–0.9)
Neutrophil #: 7.2 x10 3/mm — ABNORMAL HIGH (ref 1.4–6.5)
Platelet: 474 x10 3/mm — ABNORMAL HIGH (ref 150–440)
RDW: 17.8 % — ABNORMAL HIGH (ref 11.5–14.5)
WBC: 11 x10 3/mm (ref 3.6–11.0)

## 2013-01-21 NOTE — Telephone Encounter (Signed)
Patient needing medical clearance for a laparoscopy (Dr. Purvis Sheffield) [facility]. Needing an appointment slot please advise.

## 2013-01-21 NOTE — Telephone Encounter (Signed)
See if she can come in at 12:00 on Wednesday (01/29/13).   I will just have to work her in there.  Thanks.

## 2013-01-22 LAB — CEA: CEA: 2.5 ng/mL (ref 0.0–4.7)

## 2013-01-22 NOTE — Telephone Encounter (Signed)
appt already scheduled-I tried to reach patient on home number & her daughter at both numbers. No answer or voicemail at either number. Wanted to be sure she knew about the appt

## 2013-01-27 DIAGNOSIS — I369 Nonrheumatic tricuspid valve disorder, unspecified: Secondary | ICD-10-CM

## 2013-01-29 ENCOUNTER — Ambulatory Visit (INDEPENDENT_AMBULATORY_CARE_PROVIDER_SITE_OTHER): Payer: Medicare Other | Admitting: Internal Medicine

## 2013-01-29 ENCOUNTER — Encounter: Payer: Self-pay | Admitting: Internal Medicine

## 2013-01-29 VITALS — BP 110/70 | HR 76 | Temp 97.7°F | Ht 62.0 in | Wt 111.0 lb

## 2013-01-29 DIAGNOSIS — Z0000 Encounter for general adult medical examination without abnormal findings: Secondary | ICD-10-CM

## 2013-01-29 DIAGNOSIS — I10 Essential (primary) hypertension: Secondary | ICD-10-CM

## 2013-01-29 DIAGNOSIS — I639 Cerebral infarction, unspecified: Secondary | ICD-10-CM

## 2013-01-29 DIAGNOSIS — I6350 Cerebral infarction due to unspecified occlusion or stenosis of unspecified cerebral artery: Secondary | ICD-10-CM

## 2013-01-29 DIAGNOSIS — R55 Syncope and collapse: Secondary | ICD-10-CM

## 2013-01-29 DIAGNOSIS — R1900 Intra-abdominal and pelvic swelling, mass and lump, unspecified site: Secondary | ICD-10-CM

## 2013-02-02 ENCOUNTER — Encounter: Payer: Self-pay | Admitting: Internal Medicine

## 2013-02-02 ENCOUNTER — Telehealth: Payer: Self-pay | Admitting: Internal Medicine

## 2013-02-02 DIAGNOSIS — R1900 Intra-abdominal and pelvic swelling, mass and lump, unspecified site: Secondary | ICD-10-CM | POA: Insufficient documentation

## 2013-02-02 NOTE — Assessment & Plan Note (Signed)
Had the one episode as outlined in a previous note.  Has not reoccurred since stopping the medication.  Again, will need close intra op and post op monitoring of her heart rate and blood pressure to avoid extremes.

## 2013-02-02 NOTE — Assessment & Plan Note (Signed)
Blood pressure is doing well on no medication.  Remain off.  Off potassium.

## 2013-02-02 NOTE — Progress Notes (Signed)
Subjective:    Patient ID: Rhonda Morse, female    DOB: Jan 22, 1931, 79 y.o.   MRN: [id number]  HPI 79 year old female with past history of hypertension, hypercholesterolemia and CVA who comes in today as a work in for a pre op evaluation.  Was recently found to have a large pelvic mass.  CT revealed the large mass - uncertain of origin.  Saw Dr Hyacinth Meeker.  Is planning to have surgery.  Here today for pre op evaluation.  She is accompanied by her daughter.  History obtained from both of them.  She is still not eating well.  Did eat better yesterday.  Has lost weight.  No vomiting.  Has had some intermittent abdominal pain and fullness.  Bowels stable.  Denies any chest pain or tightness.  No increased sob.  Has noticed some increased lower extremity swelling.  Has been sitting more with legs hanging down.  Daughter reports in the am, legs are much better.  No increased redness.     Past Medical History  Diagnosis Date  . Hypertension     Remote history of hypertension   . Hypercholesteremia   . CVA (cerebral infarction) 08-98    History of CVA in   . Hypothyroidism   . Osteoarthritis   . COPD (chronic obstructive pulmonary disease)     COPD/fibrosis  . Osteoporosis   . Peripheral neuropathy   . Baker's cyst of knee     Behind the right knee    Current Outpatient Prescriptions on File Prior to Visit  Medication Sig Dispense Refill  . albuterol (PROAIR HFA) 108 (90 BASE) MCG/ACT inhaler Inhale 2 puffs into the lungs 4 (four) times daily.  8.5 g  3  . aspirin 81 MG tablet Take 81 mg by mouth daily. 1 tablet by mouth once a day      . brimonidine-timolol (COMBIGAN) 0.2-0.5 % ophthalmic solution Place 1 drop into both eyes every 12 (twelve) hours.      Marland Kitchen ezetimibe-simvastatin (VYTORIN) 10-40 MG per tablet Take 1 tablet by mouth at bedtime.  30 tablet  5  . fexofenadine (ALLEGRA) 60 MG tablet Take 1 tablet (60 mg total) by mouth daily. Take 1 tablet by mouth twice a day  30 tablet  11  .  Fluticasone-Salmeterol (ADVAIR) 250-50 MCG/DOSE AEPB Inhale 1 puff into the lungs 2 (two) times daily.  60 each  2  . levothyroxine (SYNTHROID, LEVOTHROID) 50 MCG tablet Take 1 tablet (50 mcg total) by mouth daily.  30 tablet  10  . potassium chloride (MICRO-K) 10 MEQ CR capsule Take 1 capsule (10 mEq total) by mouth daily. Take 1 capsule by mouth once a day  30 capsule  3  . tiotropium (SPIRIVA) 18 MCG inhalation capsule Place 1 capsule (18 mcg total) into inhaler and inhale daily.  30 capsule  3   No current facility-administered medications on file prior to visit.    Review of Systems Patient denies any headache, lightheadedness or dizziness.  No sinus or allergy symptoms.  No chest pain, tightness or palpitations.  No increased shortness of breath, cough or congestion.  No nausea or vomiting currently.  Some decreased appetite.  Some intermittent abdominal discomfort.  No bowel change, such as diarrhea, constipation, BRBPR or melana.  No urine change.  Has lost weight.  Lower extremity swelling as outlined.         Objective:   Physical Exam Filed Vitals:   01/29/13 1222  BP: 110/70  Pulse: 76  Temp: 97.7 F (2.5 C)   79 year old female in no acute distress.   HEENT:  Nares- clear.  Oropharynx - without lesions. NECK:  Supple.  Nontender.  No audible bruit.  HEART:  Appears to be regular. LUNGS:  No crackles or wheezing audible.  Respirations even and unlabored.  RADIAL PULSE:  Equal bilaterally.  ABDOMEN:  Soft.  No significant tenderness to palpation.  Increased fullness lower abdomen.  Bowel sounds present and normal.    EXTREMITIES:  Bilateral pedal and lower extremity edema present.  No increased erythema.       Assessment & Plan:  PRE OP EVALUATION.  EKG obtained and revealed SR with no acute ischemic changes.  I discussed with her and her daughter today regarding the need for ECHO.  Apparently they were evaluated by cardiology recently and just had an ECHO.  Will need to  obtain results.  She will need close intra op and post op monitoring of her heart rate and blood pressure to avoid extremes.  Further cardiac w/up pending cardiology's assessment and review of ECHO.  Discussed at length with the pt and her daughter today.  Discussed surgery and risk of surgery.  Pt ready to proceed with surgery.  Again will need to obtain ECHO results and further information from cardiology.

## 2013-02-02 NOTE — Telephone Encounter (Signed)
Pts daughter reported that Ms Juncaj had an ECHO at the hospital last week.  Need results.

## 2013-02-02 NOTE — Assessment & Plan Note (Signed)
She remains on aspirin daily.  Continue daily aspirin.  Will follow.

## 2013-02-02 NOTE — Assessment & Plan Note (Signed)
Recently found to have abdominal/pelvic mass on CT.  Saw Dr Hyacinth Meeker.  Planning for surgery.  Discussed at length with the patient and her daughter today.  Pt desires to proceed with surgery.

## 2013-02-03 NOTE — Telephone Encounter (Signed)
Requested report from [facility]

## 2013-02-03 NOTE — Telephone Encounter (Signed)
Noted.  Please hold until we receive.

## 2013-02-04 ENCOUNTER — Ambulatory Visit: Payer: Self-pay | Admitting: Oncology

## 2013-02-05 NOTE — Telephone Encounter (Signed)
Just checking to see if you have received her records

## 2013-02-05 NOTE — Telephone Encounter (Signed)
I did get her echo.  It was in my folder. Thanks.

## 2013-02-06 ENCOUNTER — Encounter: Payer: Self-pay | Admitting: *Deleted

## 2013-02-12 ENCOUNTER — Emergency Department: Payer: Self-pay | Admitting: Unknown Physician Specialty

## 2013-02-12 LAB — COMPREHENSIVE METABOLIC PANEL
Albumin: 1.8 g/dL — ABNORMAL LOW (ref 3.4–5.0)
Alkaline Phosphatase: 141 U/L — ABNORMAL HIGH (ref 50–136)
Anion Gap: 8 (ref 7–16)
Bilirubin,Total: 0.6 mg/dL (ref 0.2–1.0)
Chloride: 108 mmol/L — ABNORMAL HIGH (ref 98–107)
Co2: 26 mmol/L (ref 21–32)
Creatinine: 1 mg/dL (ref 0.60–1.30)
EGFR (Non-African Amer.): 53 — ABNORMAL LOW
Osmolality: 286 (ref 275–301)
Potassium: 3.5 mmol/L (ref 3.5–5.1)
SGOT(AST): 27 U/L (ref 15–37)
Sodium: 142 mmol/L (ref 136–145)

## 2013-02-12 LAB — LIPASE, BLOOD: Lipase: 77 U/L (ref 73–393)

## 2013-02-12 LAB — CBC
HCT: 37.2 % (ref 35.0–47.0)
HGB: 12 g/dL (ref 12.0–16.0)
MCH: 28.9 pg (ref 26.0–34.0)
MCHC: 32.4 g/dL (ref 32.0–36.0)
RDW: 18.2 % — ABNORMAL HIGH (ref 11.5–14.5)
WBC: 14.7 10*3/uL — ABNORMAL HIGH (ref 3.6–11.0)

## 2013-02-12 LAB — TROPONIN I: Troponin-I: <0.02 ng/mL

## 2013-02-12 LAB — TSH: Thyroid Stimulating Horm: 6.77 u[IU]/mL — ABNORMAL HIGH

## 2013-02-19 ENCOUNTER — Ambulatory Visit: Payer: Self-pay | Admitting: Gynecologic Oncology

## 2013-02-19 LAB — BASIC METABOLIC PANEL
Anion Gap: 7 (ref 7–16)
Calcium, Total: 8.2 mg/dL — ABNORMAL LOW (ref 8.5–10.1)
Chloride: 107 mmol/L (ref 98–107)
EGFR (African American): >60
Glucose: 92 mg/dL (ref 65–99)
Osmolality: 284 (ref 275–301)
Sodium: 143 mmol/L (ref 136–145)

## 2013-02-19 LAB — CBC
HGB: 12.5 g/dL (ref 12.0–16.0)
MCH: 30 pg (ref 26.0–34.0)
MCHC: 33.2 g/dL (ref 32.0–36.0)
MCV: 90 fL (ref 80–100)
RBC: 4.18 10*6/uL (ref 3.80–5.20)

## 2013-02-20 ENCOUNTER — Telehealth: Payer: Self-pay | Admitting: *Deleted

## 2013-02-20 NOTE — Telephone Encounter (Signed)
[employer].  Left message.  Waiting for call back.

## 2013-02-20 NOTE — Telephone Encounter (Signed)
Cordelia Pen called stating that the patient is scheduled for surgery in a few days. Your last note stated that you were going to get her ECHO & evaluate her cardiology history. Need information faxed to: [phone].

## 2013-02-21 NOTE — Telephone Encounter (Signed)
Talked with Cordelia Pen.  She will talk with Herbert Seta and see if any thing more is needed.

## 2013-02-24 ENCOUNTER — Encounter: Payer: Self-pay | Admitting: General Surgery

## 2013-02-24 ENCOUNTER — Ambulatory Visit (INDEPENDENT_AMBULATORY_CARE_PROVIDER_SITE_OTHER): Payer: PRIVATE HEALTH INSURANCE | Admitting: General Surgery

## 2013-02-24 ENCOUNTER — Telehealth: Payer: Self-pay | Admitting: Internal Medicine

## 2013-02-24 VITALS — BP 108/70 | HR 68 | Resp 14 | Ht 62.0 in | Wt 113.0 lb

## 2013-02-24 DIAGNOSIS — R1900 Intra-abdominal and pelvic swelling, mass and lump, unspecified site: Secondary | ICD-10-CM

## 2013-02-24 DIAGNOSIS — K439 Ventral hernia without obstruction or gangrene: Secondary | ICD-10-CM

## 2013-02-24 NOTE — Telephone Encounter (Signed)
Called.  Discussed pre op evaluation with her.

## 2013-02-24 NOTE — Telephone Encounter (Signed)
Please contact Heather Dr. Rondel Baton nurse about pt. Please call [phone].

## 2013-02-24 NOTE — Progress Notes (Signed)
Patient ID: Rhonda Morse, female   DOB: 29-Aug-1930, 79 y.o.   MRN: [id number]  No chief complaint on file.   HPI Rhonda Morse is a 79 y.o. female here for pre op exam. Patient has large mass in mid lower abdomen. Patient states this area is painful to palpation. She is scheduled for surgery to possibly remove this mass. She states that she has stopped taking her aspirin as per pre op instructions.  HPIHer CA 125 was > 900. Dr. Wendy Morse requested my assistance at surgery. Pt reports not feeling well for a few mos, poor appetite. Has lost about 10 lbs weight.  Past Medical History  Diagnosis Date  . Hypertension     Remote history of hypertension   . Hypercholesteremia   . CVA (cerebral infarction) 08-98    History of CVA in   . Hypothyroidism   . Osteoarthritis   . COPD (chronic obstructive pulmonary disease)     COPD/fibrosis  . Osteoporosis   . Peripheral neuropathy   . Baker's cyst of knee     Behind the right knee  . Stroke 15-20 yrs ago    right side  . Cancer     ovarian    Past Surgical History  Procedure Laterality Date  . Dilation and curettage of uterus      Family History  Problem Relation Age of Onset  . Aneurysm Mother   . Heart disease Brother     MI  . Cancer Father     Social History History  Substance Use Topics  . Smoking status: Former Smoker    Quit date: 08/07/1989  . Smokeless tobacco: Never Used  . Alcohol Use: No    Allergies  Allergen Reactions  . Penicillin V Potassium Rash    Current Outpatient Prescriptions  Medication Sig Dispense Refill  . aspirin 81 MG tablet Take 81 mg by mouth daily. 1 tablet by mouth once a day      . brimonidine-timolol (COMBIGAN) 0.2-0.5 % ophthalmic solution Place 1 drop into both eyes every 12 (twelve) hours.      Marland Kitchen ezetimibe-simvastatin (VYTORIN) 10-40 MG per tablet Take 1 tablet by mouth at bedtime.  30 tablet  5  . fexofenadine (ALLEGRA) 60 MG tablet Take 1 tablet (60 mg total) by mouth daily.  Take 1 tablet by mouth twice a day  30 tablet  11  . levothyroxine (SYNTHROID, LEVOTHROID) 50 MCG tablet Take 1 tablet (50 mcg total) by mouth daily.  30 tablet  10  . albuterol (PROAIR HFA) 108 (90 BASE) MCG/ACT inhaler Inhale 2 puffs into the lungs 4 (four) times daily.  8.5 g  3  . Fluticasone-Salmeterol (ADVAIR) 250-50 MCG/DOSE AEPB Inhale 1 puff into the lungs 2 (two) times daily.  60 each  2  . potassium chloride (MICRO-K) 10 MEQ CR capsule Take 1 capsule (10 mEq total) by mouth daily. Take 1 capsule by mouth once a day  30 capsule  3  . tiotropium (SPIRIVA) 18 MCG inhalation capsule Place 1 capsule (18 mcg total) into inhaler and inhale daily.  30 capsule  3   No current facility-administered medications for this visit.    Review of Systems Review of Systems  Constitutional: Positive for appetite change and fatigue.  Respiratory: Negative for shortness of breath.   Cardiovascular: Negative for chest pain.  Gastrointestinal: Positive for abdominal pain and abdominal distention. Negative for nausea and diarrhea.  Genitourinary: Negative for dysuria and difficulty urinating.    Blood pressure  108/70, pulse 68, resp. rate 14, height 5\' 2"  (1.575 m), weight 113 lb (51.256 kg).  Physical Exam Physical Exam  Constitutional: Vital signs are normal. She appears cachectic.  Neck: No tracheal deviation present. No mass and no thyromegaly present.  Cardiovascular: Normal rate, regular rhythm and normal heart sounds.   Pulmonary/Chest: Effort normal and breath sounds normal.  Abdominal: Normal appearance. There is no hepatosplenomegaly. There is tenderness. A hernia is present.      Data Reviewed Dr.illers,s notes  Assessment    Suspected ovarian cancer     Plan    Surgery as planned. She may need a venous port -will discuss with Dr. Doylene Canning.        Mahamadou Weltz G 02/26/2013, 6:34 AM

## 2013-02-25 ENCOUNTER — Encounter: Payer: Self-pay | Admitting: *Deleted

## 2013-02-25 ENCOUNTER — Inpatient Hospital Stay: Payer: Self-pay | Admitting: General Surgery

## 2013-02-25 DIAGNOSIS — C569 Malignant neoplasm of unspecified ovary: Secondary | ICD-10-CM

## 2013-02-25 DIAGNOSIS — R1900 Intra-abdominal and pelvic swelling, mass and lump, unspecified site: Secondary | ICD-10-CM

## 2013-02-25 LAB — CBC WITH DIFFERENTIAL/PLATELET
Basophil %: 0.6 %
Eosinophil #: 0 10*3/uL (ref 0.0–0.7)
HGB: 12.8 g/dL (ref 12.0–16.0)
Lymphocyte %: 6.3 %
MCH: 29.6 pg (ref 26.0–34.0)
MCHC: 32.8 g/dL (ref 32.0–36.0)
MCV: 90 fL (ref 80–100)
Monocyte #: 2.5 x10 3/mm — ABNORMAL HIGH (ref 0.2–0.9)
Neutrophil #: 32.1 10*3/uL — ABNORMAL HIGH (ref 1.4–6.5)
Neutrophil %: 86.4 %
Platelet: 181 10*3/uL (ref 150–440)

## 2013-02-25 LAB — BASIC METABOLIC PANEL
Anion Gap: 13 (ref 7–16)
Chloride: 103 mmol/L (ref 98–107)
EGFR (African American): 43 — ABNORMAL LOW
EGFR (Non-African Amer.): 37 — ABNORMAL LOW
Glucose: 358 mg/dL — ABNORMAL HIGH (ref 65–99)
Osmolality: 279 (ref 275–301)
Potassium: 3.7 mmol/L (ref 3.5–5.1)

## 2013-02-25 LAB — HEMOGLOBIN: HGB: 10.7 g/dL — ABNORMAL LOW (ref 12.0–16.0)

## 2013-02-25 LAB — TROPONIN I: Troponin-I: <0.02 ng/mL

## 2013-02-26 ENCOUNTER — Encounter: Payer: Self-pay | Admitting: General Surgery

## 2013-02-26 LAB — CBC WITH DIFFERENTIAL/PLATELET
Basophil %: 0.2 %
Eosinophil #: 0 10*3/uL (ref 0.0–0.7)
Eosinophil %: 0 %
HGB: 10.8 g/dL — ABNORMAL LOW (ref 12.0–16.0)
Lymphocyte #: 5 10*3/uL — ABNORMAL HIGH (ref 1.0–3.6)
MCV: 89 fL (ref 80–100)
Monocyte #: 3 x10 3/mm — ABNORMAL HIGH (ref 0.2–0.9)
Neutrophil #: 29.7 10*3/uL — ABNORMAL HIGH (ref 1.4–6.5)
Neutrophil %: 78.7 %

## 2013-02-26 LAB — BASIC METABOLIC PANEL
Anion Gap: 10 (ref 7–16)
BUN: 11 mg/dL (ref 7–18)
Calcium, Total: 6.5 mg/dL — CL (ref 8.5–10.1)
Chloride: 104 mmol/L (ref 98–107)
Creatinine: 1.63 mg/dL — ABNORMAL HIGH (ref 0.60–1.30)
EGFR (African American): 34 — ABNORMAL LOW
EGFR (Non-African Amer.): 29 — ABNORMAL LOW
Glucose: 273 mg/dL — ABNORMAL HIGH (ref 65–99)
Potassium: 3.5 mmol/L (ref 3.5–5.1)
Sodium: 134 mmol/L — ABNORMAL LOW (ref 136–145)

## 2013-02-27 ENCOUNTER — Encounter: Payer: Self-pay | Admitting: Gynecologic Oncology

## 2013-02-27 LAB — COMPREHENSIVE METABOLIC PANEL
Albumin: 1.1 g/dL — ABNORMAL LOW (ref 3.4–5.0)
Bilirubin,Total: 0.7 mg/dL (ref 0.2–1.0)
Creatinine: 1.55 mg/dL — ABNORMAL HIGH (ref 0.60–1.30)
EGFR (African American): 36 — ABNORMAL LOW
Glucose: 89 mg/dL (ref 65–99)
Osmolality: 276 (ref 275–301)
SGOT(AST): 429 U/L — ABNORMAL HIGH (ref 15–37)
SGPT (ALT): 151 U/L — ABNORMAL HIGH (ref 12–78)

## 2013-02-27 LAB — CBC WITH DIFFERENTIAL/PLATELET
Basophil #: 0 10*3/uL (ref 0.0–0.1)
Basophil %: 0.1 %
Eosinophil #: 0 10*3/uL (ref 0.0–0.7)
Eosinophil %: 0 %
HCT: 28.3 % — ABNORMAL LOW (ref 35.0–47.0)
Lymphocyte #: 2.5 10*3/uL (ref 1.0–3.6)
MCH: 28.7 pg (ref 26.0–34.0)
MCHC: 32.8 g/dL (ref 32.0–36.0)
Monocyte #: 2 x10 3/mm — ABNORMAL HIGH (ref 0.2–0.9)
Neutrophil #: 32.8 10*3/uL — ABNORMAL HIGH (ref 1.4–6.5)
Platelet: 210 10*3/uL (ref 150–440)
RBC: 3.24 10*6/uL — ABNORMAL LOW (ref 3.80–5.20)
RDW: 16.3 % — ABNORMAL HIGH (ref 11.5–14.5)
WBC: 37.4 10*3/uL — ABNORMAL HIGH (ref 3.6–11.0)

## 2013-02-27 LAB — PLATELET COUNT: Platelet: 232 10*3/uL (ref 150–440)

## 2013-02-27 LAB — PROTIME-INR: Prothrombin Time: 22.8 secs — ABNORMAL HIGH (ref 11.5–14.7)

## 2013-02-28 ENCOUNTER — Ambulatory Visit: Payer: Medicare Other | Admitting: Internal Medicine

## 2013-02-28 LAB — CBC WITH DIFFERENTIAL/PLATELET
Eosinophil #: 0 10*3/uL (ref 0.0–0.7)
HCT: 25.7 % — ABNORMAL LOW (ref 35.0–47.0)
HGB: 8.7 g/dL — ABNORMAL LOW (ref 12.0–16.0)
Lymphocyte %: 6.3 %
MCH: 29.7 pg (ref 26.0–34.0)
MCHC: 33.7 g/dL (ref 32.0–36.0)
MCV: 88 fL (ref 80–100)
Neutrophil #: 19.4 10*3/uL — ABNORMAL HIGH (ref 1.4–6.5)
Platelet: 241 10*3/uL (ref 150–440)
RDW: 17.1 % — ABNORMAL HIGH (ref 11.5–14.5)
WBC: 21.8 10*3/uL — ABNORMAL HIGH (ref 3.6–11.0)

## 2013-02-28 LAB — COMPREHENSIVE METABOLIC PANEL
Albumin: 1 g/dL — ABNORMAL LOW (ref 3.4–5.0)
Anion Gap: 11 (ref 7–16)
Bilirubin,Total: 1 mg/dL (ref 0.2–1.0)
Calcium, Total: 6.5 mg/dL — CL (ref 8.5–10.1)
Chloride: 110 mmol/L — ABNORMAL HIGH (ref 98–107)
EGFR (African American): 40 — ABNORMAL LOW
EGFR (Non-African Amer.): 34 — ABNORMAL LOW
Osmolality: 277 (ref 275–301)
Total Protein: 3.1 g/dL — ABNORMAL LOW (ref 6.4–8.2)

## 2013-03-01 LAB — HEMOGLOBIN
HGB: 7.9 g/dL — ABNORMAL LOW (ref 12.0–16.0)
HGB: 7.2 g/dL — ABNORMAL LOW (ref 12.0–16.0)

## 2013-03-01 LAB — BASIC METABOLIC PANEL
Anion Gap: 12 (ref 7–16)
Calcium, Total: 6.2 mg/dL — CL (ref 8.5–10.1)
Co2: 17 mmol/L — ABNORMAL LOW (ref 21–32)
EGFR (African American): 48 — ABNORMAL LOW
Glucose: 117 mg/dL — ABNORMAL HIGH (ref 65–99)
Osmolality: 292 (ref 275–301)
Sodium: 146 mmol/L — ABNORMAL HIGH (ref 136–145)

## 2013-03-02 LAB — CBC WITH DIFFERENTIAL/PLATELET
Basophil #: 0 10*3/uL (ref 0.0–0.1)
Eosinophil #: 0 10*3/uL (ref 0.0–0.7)
HCT: 28.3 % — ABNORMAL LOW (ref 35.0–47.0)
Lymphocyte #: 1.8 10*3/uL (ref 1.0–3.6)
Lymphocyte %: 6.7 %
Lymphocytes: 4 %
MCHC: 33.3 g/dL (ref 32.0–36.0)
Metamyelocyte: 3 %
Monocyte #: 1 x10 3/mm — ABNORMAL HIGH (ref 0.2–0.9)
Monocytes: 2 %
Neutrophil %: 89.2 %
Platelet: 158 10*3/uL (ref 150–440)
Segmented Neutrophils: 88 %

## 2013-03-02 LAB — BASIC METABOLIC PANEL
Anion Gap: 9 (ref 7–16)
Co2: 19 mmol/L — ABNORMAL LOW (ref 21–32)
Creatinine: 1.11 mg/dL (ref 0.60–1.30)
EGFR (African American): 54 — ABNORMAL LOW
Osmolality: 302 (ref 275–301)
Potassium: 3.1 mmol/L — ABNORMAL LOW (ref 3.5–5.1)
Sodium: 150 mmol/L — ABNORMAL HIGH (ref 136–145)

## 2013-03-02 LAB — MAGNESIUM: Magnesium: 2.3 mg/dL

## 2013-03-03 ENCOUNTER — Encounter: Payer: Self-pay | Admitting: General Surgery

## 2013-03-03 LAB — CBC WITH DIFFERENTIAL/PLATELET
Bands: 3 %
MCH: 29.3 pg (ref 26.0–34.0)
MCHC: 34.1 g/dL (ref 32.0–36.0)
MCV: 86 fL (ref 80–100)
Metamyelocyte: 1 %
Monocytes: 5 %
NRBC/100 WBC: 7 /
RDW: 18.5 % — ABNORMAL HIGH (ref 11.5–14.5)
WBC: 24.3 10*3/uL — ABNORMAL HIGH (ref 3.6–11.0)

## 2013-03-03 LAB — PHOSPHORUS
Phosphorus: 1 mg/dL — CL (ref 2.5–4.9)
Phosphorus: 2.1 mg/dL — ABNORMAL LOW (ref 2.5–4.9)

## 2013-03-03 LAB — COMPREHENSIVE METABOLIC PANEL
Alkaline Phosphatase: 153 U/L — ABNORMAL HIGH (ref 50–136)
Anion Gap: 6 — ABNORMAL LOW (ref 7–16)
Bilirubin,Total: 1.4 mg/dL — ABNORMAL HIGH (ref 0.2–1.0)
Co2: 20 mmol/L — ABNORMAL LOW (ref 21–32)
Creatinine: 0.96 mg/dL (ref 0.60–1.30)
Glucose: 116 mg/dL — ABNORMAL HIGH (ref 65–99)
Osmolality: 296 (ref 275–301)
SGOT(AST): 63 U/L — ABNORMAL HIGH (ref 15–37)
SGPT (ALT): 81 U/L — ABNORMAL HIGH (ref 12–78)
Sodium: 148 mmol/L — ABNORMAL HIGH (ref 136–145)

## 2013-03-03 LAB — MAGNESIUM: Magnesium: 2.3 mg/dL

## 2013-03-04 LAB — FIBRIN DEGRADATION PROD.(ARMC ONLY): Fibrin Degradation Prod.: >40 ug/ml (ref 2.1–7.7)

## 2013-03-04 LAB — FIBRINOGEN: Fibrinogen: 445 mg/dL (ref 210–470)

## 2013-03-05 ENCOUNTER — Encounter: Payer: Self-pay | Admitting: Internal Medicine

## 2013-03-05 DIAGNOSIS — J984 Other disorders of lung: Secondary | ICD-10-CM

## 2013-03-05 LAB — CBC WITH DIFFERENTIAL/PLATELET
Basophil #: 0.1 10*3/uL (ref 0.0–0.1)
Eosinophil #: 0.1 10*3/uL (ref 0.0–0.7)
Eosinophil %: 0.8 %
MCH: 29.5 pg (ref 26.0–34.0)
MCV: 88 fL (ref 80–100)
Monocyte %: 8 %
Neutrophil %: 82.1 %

## 2013-03-05 LAB — COMPREHENSIVE METABOLIC PANEL
Albumin: 0.6 g/dL — ABNORMAL LOW (ref 3.4–5.0)
Anion Gap: 5 — ABNORMAL LOW (ref 7–16)
Chloride: 121 mmol/L — ABNORMAL HIGH (ref 98–107)
Creatinine: 0.76 mg/dL (ref 0.60–1.30)
Glucose: 123 mg/dL — ABNORMAL HIGH (ref 65–99)
Osmolality: 301 (ref 275–301)
Potassium: 3.7 mmol/L (ref 3.5–5.1)
SGOT(AST): 44 U/L — ABNORMAL HIGH (ref 15–37)
SGPT (ALT): 57 U/L (ref 12–78)
Sodium: 150 mmol/L — ABNORMAL HIGH (ref 136–145)
Total Protein: 3.5 g/dL — ABNORMAL LOW (ref 6.4–8.2)

## 2013-03-05 LAB — PHOSPHORUS: Phosphorus: 2.4 mg/dL — ABNORMAL LOW (ref 2.5–4.9)

## 2013-03-05 LAB — MAGNESIUM: Magnesium: 1.7 mg/dL — ABNORMAL LOW

## 2013-03-06 LAB — BASIC METABOLIC PANEL
Chloride: 114 mmol/L — ABNORMAL HIGH (ref 98–107)
Creatinine: 0.8 mg/dL (ref 0.60–1.30)
EGFR (African American): >60
EGFR (Non-African Amer.): >60
Glucose: 166 mg/dL — ABNORMAL HIGH (ref 65–99)
Osmolality: 294 (ref 275–301)
Potassium: 3.3 mmol/L — ABNORMAL LOW (ref 3.5–5.1)
Sodium: 145 mmol/L (ref 136–145)

## 2013-03-06 LAB — CBC WITH DIFFERENTIAL/PLATELET
Eosinophil: 2 %
HCT: 25.8 % — ABNORMAL LOW (ref 35.0–47.0)
Lymphocytes: 10 %
MCH: 30.2 pg (ref 26.0–34.0)
Metamyelocyte: 1 %
Monocytes: 9 %
NRBC/100 WBC: 3 /
Platelet: 109 10*3/uL — ABNORMAL LOW (ref 150–440)
RBC: 2.94 10*6/uL — ABNORMAL LOW (ref 3.80–5.20)
RDW: 18.1 % — ABNORMAL HIGH (ref 11.5–14.5)
Segmented Neutrophils: 78 %
WBC: 25.3 10*3/uL — ABNORMAL HIGH (ref 3.6–11.0)

## 2013-03-06 LAB — MAGNESIUM: Magnesium: 1.7 mg/dL — ABNORMAL LOW

## 2013-03-06 LAB — ALBUMIN: Albumin: 0.7 g/dL — ABNORMAL LOW (ref 3.4–5.0)

## 2013-03-07 ENCOUNTER — Ambulatory Visit: Payer: Self-pay | Admitting: Oncology

## 2013-03-07 ENCOUNTER — Encounter: Payer: Self-pay | Admitting: General Surgery

## 2013-03-07 LAB — APTT: Activated PTT: 34.5 secs (ref 23.6–35.9)

## 2013-03-08 LAB — BASIC METABOLIC PANEL
Anion Gap: 7 (ref 7–16)
BUN: 22 mg/dL — ABNORMAL HIGH (ref 7–18)
Chloride: 103 mmol/L (ref 98–107)
EGFR (African American): >60
EGFR (Non-African Amer.): >60
Glucose: 162 mg/dL — ABNORMAL HIGH (ref 65–99)
Potassium: 4.6 mmol/L (ref 3.5–5.1)
Sodium: 138 mmol/L (ref 136–145)

## 2013-03-08 LAB — CBC WITH DIFFERENTIAL/PLATELET
Bands: 2 %
Monocytes: 9 %
Segmented Neutrophils: 86 %
WBC: 27.6 10*3/uL — ABNORMAL HIGH (ref 3.6–11.0)

## 2013-03-09 LAB — CBC WITH DIFFERENTIAL/PLATELET
Bands: 4 %
Comment - H1-Com5: NORMAL
HCT: 24.4 % — ABNORMAL LOW (ref 35.0–47.0)
Lymphocytes: 6 %
MCH: 29.7 pg (ref 26.0–34.0)
MCHC: 33.5 g/dL (ref 32.0–36.0)
Monocytes: 4 %
NRBC/100 WBC: 1 /
Platelet: 242 10*3/uL (ref 150–440)
RBC: 2.75 10*6/uL — ABNORMAL LOW (ref 3.80–5.20)
WBC: 31.3 10*3/uL — ABNORMAL HIGH (ref 3.6–11.0)

## 2013-03-09 LAB — BASIC METABOLIC PANEL
BUN: 26 mg/dL — ABNORMAL HIGH (ref 7–18)
Chloride: 103 mmol/L (ref 98–107)
Co2: 29 mmol/L (ref 21–32)
Creatinine: 0.62 mg/dL (ref 0.60–1.30)
EGFR (African American): >60
EGFR (Non-African Amer.): >60
Potassium: 4.4 mmol/L (ref 3.5–5.1)

## 2013-03-09 LAB — MAGNESIUM: Magnesium: 2 mg/dL

## 2013-03-10 LAB — CBC WITH DIFFERENTIAL/PLATELET
Comment - H1-Com6: NORMAL
HGB: 8 g/dL — ABNORMAL LOW (ref 12.0–16.0)
Lymphocytes: 6 %
MCH: 30.3 pg (ref 26.0–34.0)
MCHC: 34 g/dL (ref 32.0–36.0)
MCV: 89 fL (ref 80–100)
Monocytes: 3 %
Segmented Neutrophils: 91 %

## 2013-03-10 LAB — BASIC METABOLIC PANEL
Chloride: 103 mmol/L (ref 98–107)
Creatinine: 0.67 mg/dL (ref 0.60–1.30)
EGFR (African American): >60
EGFR (Non-African Amer.): >60
Osmolality: 283 (ref 275–301)
Potassium: 3.8 mmol/L (ref 3.5–5.1)

## 2013-03-10 LAB — PHOSPHORUS: Phosphorus: 3.8 mg/dL (ref 2.5–4.9)

## 2013-03-11 LAB — CBC WITH DIFFERENTIAL/PLATELET
Basophil %: 1.6 %
HCT: 22.2 % — ABNORMAL LOW (ref 35.0–47.0)
HGB: 7.6 g/dL — ABNORMAL LOW (ref 12.0–16.0)
Lymphocyte %: 5.8 %
MCHC: 34.1 g/dL (ref 32.0–36.0)
Neutrophil #: 16.5 10*3/uL — ABNORMAL HIGH (ref 1.4–6.5)
Platelet: 324 10*3/uL (ref 150–440)
RBC: 2.45 10*6/uL — ABNORMAL LOW (ref 3.80–5.20)
WBC: 19.2 10*3/uL — ABNORMAL HIGH (ref 3.6–11.0)

## 2013-03-11 LAB — BASIC METABOLIC PANEL
Anion Gap: 7 (ref 7–16)
Calcium, Total: 7.3 mg/dL — ABNORMAL LOW (ref 8.5–10.1)
Creatinine: 0.73 mg/dL (ref 0.60–1.30)
EGFR (African American): >60
Glucose: 136 mg/dL — ABNORMAL HIGH (ref 65–99)
Osmolality: 286 (ref 275–301)
Sodium: 139 mmol/L (ref 136–145)

## 2013-03-11 LAB — PHOSPHORUS: Phosphorus: 3.4 mg/dL (ref 2.5–4.9)

## 2013-03-11 LAB — ALBUMIN: Albumin: 0.8 g/dL — ABNORMAL LOW (ref 3.4–5.0)

## 2013-03-12 ENCOUNTER — Encounter: Payer: Self-pay | Admitting: General Surgery

## 2013-03-12 LAB — SODIUM: Sodium: 140 mmol/L (ref 136–145)

## 2013-03-12 LAB — CALCIUM: Calcium, Total: 7.5 mg/dL — ABNORMAL LOW (ref 8.5–10.1)

## 2013-03-12 LAB — ALBUMIN: Albumin: 0.8 g/dL — ABNORMAL LOW (ref 3.4–5.0)

## 2013-03-12 LAB — POTASSIUM: Potassium: 4 mmol/L (ref 3.5–5.1)

## 2013-03-13 LAB — PHOSPHORUS: Phosphorus: 3.5 mg/dL (ref 2.5–4.9)

## 2013-03-13 LAB — CALCIUM: Calcium, Total: 7.6 mg/dL — ABNORMAL LOW (ref 8.5–10.1)

## 2013-03-13 LAB — SODIUM: Sodium: 140 mmol/L (ref 136–145)

## 2013-03-13 LAB — MAGNESIUM: Magnesium: 2.1 mg/dL

## 2013-03-14 ENCOUNTER — Encounter: Payer: Self-pay | Admitting: *Deleted

## 2013-03-14 LAB — WBC: WBC: 14.2 10*3/uL — ABNORMAL HIGH (ref 3.6–11.0)

## 2013-03-14 LAB — CALCIUM: Calcium, Total: 7.7 mg/dL — ABNORMAL LOW (ref 8.5–10.1)

## 2013-03-14 LAB — POTASSIUM: Potassium: 3.7 mmol/L (ref 3.5–5.1)

## 2013-03-14 LAB — PHOSPHORUS: Phosphorus: 3.8 mg/dL (ref 2.5–4.9)

## 2013-03-15 LAB — CULTURE, BLOOD (SINGLE)

## 2013-03-18 ENCOUNTER — Telehealth: Payer: Self-pay | Admitting: *Deleted

## 2013-03-18 NOTE — Telephone Encounter (Signed)
Discussed FMLA papers for daughter Hal Morales to be completed for her mother.

## 2013-03-28 ENCOUNTER — Telehealth: Payer: Self-pay | Admitting: Internal Medicine

## 2013-03-28 NOTE — Telephone Encounter (Signed)
FYI  I called pt to confirm she wanted to cancel appointment when i didn't get an answer for a couple days i call Rhonda Morse her daughter to confirm.  Rhonda Morse stated they wanted to cancel appointment that [name] is in [facility]

## 2013-03-31 ENCOUNTER — Ambulatory Visit: Payer: Medicare Other | Admitting: Internal Medicine

## 2013-04-07 ENCOUNTER — Ambulatory Visit: Payer: Self-pay | Admitting: Oncology

## 2013-05-07 DEATH — deceased

## 2014-06-08 ENCOUNTER — Encounter: Payer: Self-pay | Admitting: General Surgery

## 2014-11-27 NOTE — Consult Note (Signed)
History of Present Illness:  Reason for Consult Suspected ovarian  cancer status post debulking surgery.  Further pathology is pending   Date of Diagnosis 25-Feb-2013   HPI   .17 81 years  old lady with a previous history of cardiac disease, dementia, was found to have over ovarian mass patient underwent debulking surgery.  Postoperative to the patient developed multiple complications including hypotension, hypoxia, patient continued to be very lethargic.  Being seen for possibility of further evaluation and and to discuss the goal of treatment and desire of the patient's family for further management and planning of treatmentis still pending  PFSH:  Comments Sister had ovarian cancer.   Comments Patient does not smoke.  Does not drink.   Additional Past Medical and Surgical History Dementia Hypertension Osteoporosis Peripheral neuropathy   Review of Systems:  General weakness  fatigue   Performance Status (ECOG) 3   Psych anxiety   Review of Systems   patient is non cmmunicative .  Most of the review of system was obtained from patient's daughter reviewing the chart.episodes of hypotension hypoxia  NURSING NOTES: **Vital Signs.:   05-Aug-14 15:23   Vital Signs Type: Routine   Temperature Temperature (F): 98.1   Celsius: 36.7   Temperature Source: oral   Pulse Pulse: 123   Respirations Respirations: 19   Systolic BP Systolic BP: 268   Diastolic BP (mmHg) Diastolic BP (mmHg): 68   Mean BP: 80   Pulse Ox % Pulse Ox %: 94   Pulse Ox Activity Level: At rest   Oxygen Delivery: 2L   Physical Exam:  General Patient is very lethargic.  Lying in the bed.  Does not respond to any verbal questions   HEENT: normal   Lungs: crepitations  rhonchi   Cardiac: Tachycardia   Breast: not examined   Abdomen: soft  nontender  positive bowel sounds   Skin: intact   Extremities: edema   Physical Exam Neurological system: Cranial nerves are intact.  Patient is  lethargic . poorly responsive.  No other focal and localizing sign     Extubated: Patient is extubated and placed on 2 lpm o2 [place]. Placed on high fowlers position, cuff deflated, suctioned orally and endotracheally prior to extubation   arthritis:    Shingles:    Hypertension:    CVA/Stroke:    Hypothyroidism:    Hysterectomy - Partial:    Penicillin: Rash    levothyroxine 50 mcg (0.05 mg) oral tablet: 1 tab(s) orally once a day (in the morning), Status: Active, Quantity: 0, Refills: None   Vytorin 10 mg-40 mg oral tablet: 1 tab(s) orally once a day (at bedtime), Status: Active, Quantity: 0, Refills: None   aspirin 81 mg oral tablet: 1 tab(s) orally once a day, Status: Active, Quantity: 0, Refills: None   Allegra 60 mg oral tablet: 1 tab(s) orally once a day, Status: Active, Quantity: 0, Refills: None  Laboratory Results: Hepatic:  05-Aug-14 04:38   Albumin, Serum  0.8 (Result(s) reported on 11 Mar 2013 at 05:03AM.)  Routine Chem:  05-Aug-14 04:38   Magnesium, Serum 2.1 (1.8-2.4 THERAPEUTIC RANGE: 4-7 mg/dL TOXIC: > 10 mg/dL  -----------------------)  Glucose, Serum  136  BUN  31  Creatinine (comp) 0.73  Sodium, Serum 139  Potassium, Serum 3.5  Chloride, Serum 104  CO2, Serum 28  Calcium (Total), Serum  7.3  Anion Gap 7  Osmolality (calc) 286  eGFR (African American) >60  eGFR (Non-African American) >60 (eGFR values <63mL/min/1.73 m2 may  be an indication of chronic kidney disease (CKD). Calculated eGFR is useful in patients with stable renal function. The eGFR calculation will not be reliable in acutely ill patients when serum creatinine is changing rapidly. It is not useful in  patients on dialysis. The eGFR calculation may not be applicable to patients at the low and high extremes of body sizes, pregnant women, and vegetarians.)  Routine Hem:  05-Aug-14 04:38   WBC (CBC)  19.2  RBC (CBC)  2.45  Hemoglobin (CBC)  7.6  Hematocrit (CBC)  22.2  Platelet  Count (CBC) 324  MCV 91  MCH 30.9  MCHC 34.1  RDW  18.6  Neutrophil % 86.2  Lymphocyte % 5.8  Monocyte % 6.2  Eosinophil % 0.2  Basophil % 1.6  Neutrophil #  16.5  Lymphocyte # 1.1  Monocyte #  1.2  Eosinophil # 0.0  Basophil #  0.3 (Result(s) reported on 11 Mar 2013 at 05:04AM.)   Assessment and Plan: Impression:   large, complex, firm pelvic mass reaching into the abdomen,post debulking surgerycomplication with hypotension hypoxiais  lethargic,  leukocytosis, anemia,  Hypoxia Hypoalbuminemia    had prolonged discussion with patient's daughter .  At present time unless there is significant improvement in patient's performance status she is not a candidate for any chemotherapy. with intensive support we have not made any progress with patient's neurological symptoms. I  discussed palliative care options,  hospice care.  No code.  daughter . is going to discuss this with her family and decide regarding hospice home versus home care with hospice  as well as regarding no codewould be in touch with Dr. Vallery Ridge and request her to followup with patient's family.  follow at present time and if hemoglobin continues to drop may consider blood transfusion  Plan:   .........  CC Referral:  cc: Dr. Einar Pheasant   Electronic Signatures: Jobe Gibbon (MD)  (Signed 05-Aug-14 20:25)  Authored: HISTORY OF PRESENT ILLNESS, PFSH, ROS, NURSING NOTES, PE, PAST MEDICAL HISTORY, ALLERGIES, HOME MEDICATIONS, LABS, ASSESSMENT AND PLAN, CC Referring Physician   Last Updated: 05-Aug-14 20:25 by Jobe Gibbon (MD)

## 2014-11-27 NOTE — Op Note (Signed)
PATIENT NAME:  Rhonda Morse, WUNDER MR#:  [id number] DATE OF BIRTH:  1931-07-17  DATE OF PROCEDURE:  02/25/2013  PREOPERATIVE DIAGNOSIS:  Large pelvic mass, suspected ovarian cancer.   POSTOPERATIVE DIAGNOSIS:  Large pelvic mass, suspected ovarian cancer.   OPERATION:  1.  Insertion of a venous access port with ultrasound and fluoroscopic guidance.  2.  Laparotomy, resection of ovarian mass from the left and the right ovary.  3.  Repair of serosal tear in the sigmoid colon   SURGEONS:  S.G. Jamal Collin, MD, and Jacquelyne Balint, MD.  ANESTHESIA:  General.   COMPLICATIONS:  None.   ESTIMATED BLOOD LOSS:  Approximately 1200 mL, replaced with 2 units of blood and over 2 liters of crystalloid.  DESCRIPTION OF PROCEDURE:  Placement of venous access port was performed first. The patient was put to sleep and then placed in the slight Trendelenburg position. The left upper chest was prepped and draped out as a sterile field. Ultrasound probe was brought up to the field. The subclavian vein was identified beneath the clavicle on the lateral end. A small skin incision was made and through this, a  needle was placed successfully into the subclavian vein with ultrasound guidance. There was free withdrawal of blood.   Following this, using the Seldinger technique, a catheter was positioned going towards the superior vena cava with the skin entrance marking at about 23 cm. This was done with fluoroscopic guidance. Following this, an incision was made underneath the second costal cartilage and a subcutaneous pocket was then created with cautery. The catheter was tunneled to this site and then cut to approximate length. It was then fixed to a prefilled port. The port was placed in the pocket and anchored to the underlying fascia with 3 stitches of 2-0 Prolene. Fluoroscopy was again performed to ensure that the catheter was still in good position. The subcutaneous tissue was closed with 3-0 Vicryl and the skin closed with  subcuticular 4-0 Vicryl, covered with Dermabond.   Following this, the drapes were removed. Laparotomy was then undertaken. This portion will be dictated by Dr. Jacquelyne Balint.   In the course of the laparotomy, the patient's large left ovarian mass was noted to be extremely necrotic and friable and bled easily, accounting for her blood loss. Subsequently, this was controlled after the ovarian pedicle was ligated and the mass removed. The right ovary was also removed.  The uterus was extremely small and was left in place. There were some implants noted on the mesentery of the small bowel and also the mass was adjoining the sigmoid colon densely in its midportion. This was freed and a small serosal tear was identified in the sigmoid colon which was repaired with interrupted 3-0 silk stitches.   In the pelvic floor area was a moderate amount of oozing noted from where the tumor was removed, and this was controlled with pressure and subsequent placement of 5 mL Surgiflo  containing thrombin. After ensuring hemostasis, the abdomen was irrigated and closed with interrupted figure-of-eight stitches in the fascia of 0 Prolene, subcutaneous tissue with 2-0 Vicryl and the skin with staples.   The patient subsequently was returned to the recovery room still intubated. Postsurgery, the patient was extremely slow to wake up, and in view of this, again, also was noted to be mildly hypotensive, requiring some pressors and fluids and seemed to stabilize thereafter. She was subsequently transferred to the CCU and intubated with plan for subsequent extubation if she woke up satisfactorily.  ____________________________ S.Robinette Haines, MD sgs:np D: 02/25/2013 18:27:32 ET T: 02/25/2013 20:54:35 ET JOB#: [id number]  cc: Synthia Innocent. Jamal Collin, MD, <Dictator> [facility] Robinette Haines MD ELECTRONICALLY SIGNED 02/26/2013 8:53

## 2014-11-27 NOTE — Consult Note (Signed)
PATIENT NAME:  Rhonda Morse, Rhonda Morse MR#:  [id number] DATE OF BIRTH:  1930-08-21  DATE OF CONSULTATION:  02/25/2013  REFERRING PHYSICIAN:  S.G. Jamal Collin, MD CONSULTING PHYSICIAN:  Ceasar Lund. Anselm Jungling, MD  FAMILY CARE PHYSICIAN: Einar Pheasant, MD  REASON FOR CONSULT: Medical management.   HISTORY OF PRESENT ILLNESS: The patient is an 81 year old female, with a past history of COPD, hyperlipidemia, hypothyroid, previous cerebrovascular accident and known case of pelvic tumor, following with Dr. Jamal Collin and brought into today for PermCath placement and surgery to remove the tumor.  After having the surgery, the patient became hypotensive, hypoxic, and slightly drowsy, and so she is being transferred to CCU postoperatively and medical consult is called in for further medical management of these issues.   REVIEW OF SYSTEMS: Unable to access as the patient is drowsy. History obtained from previous medical record and patient's daughter, who is present in the room.   PAST MEDICAL HISTORY: Arthritis, shingles, hypertension, COPD, previous history of CVA, hypothyroidism.   PAST SURGICAL HISTORY: Hysterectomy and had surgery of pelvic mass today.   ALLERGIES: PENICILLIN.   SOCIAL HISTORY: She smoked heavily in the past, quit a few years ago. Drank alcohol in the past. Currently lives by herself. Independent in day-to-day activities in living. For the last few weeks she was gradually getting weak, and last week she had a fall, and as per daughter, for the last few days, she was mostly staying in the bed or in the recliner.   FAMILY HISTORY: Hypertension.   HOME MEDICATIONS: Vytorin 10 mg/40 mg one tablet once a day, levothyroxine 50 mcg once a day, aspirin 81 mg once a day, Allegra 60 mg oral tablet once a day.   PHYSICAL EXAMINATION:  VITAL SIGNS: As per the postop records. Her blood pressure was 74/50, which came up slightly after transfusion, but then again dropped to 74/40 at 4:30. Heart rate was  running 120 at that time and after extubation, which was around 5:00 p.m., her blood pressure dropped again,  68/52, and then she was started on norepinephrine drip. Currently, blood pressure is maintained with the help of norepinephrine with mean blood pressure around 60. Heart rate is running in 110. Oxygen saturation 96% with 2 liters nasal cannula oxygen supplementation, and temperature is 97.4.   LABORATORY RESULTS:  As before scheduled surgery, there were no labs done today but previous labs, which were one week ago, creatinine is normal. Her hemoglobin was 12.5 at that time which dropped to 10.7 after surgery today, and due to an episode of hypotension and hypoxia,  ABG was done with pH 7.35, pCO2 of 26 and pO2 was 69 on 35% FiO2.   ASSESSMENT AND PLAN: An 81 year old female with a past medical history of chronic obstructive pulmonary disease, hyperlipidemia, hypothyroid, and hypertension presented with the surgery of pelvic mass and has hypotension postsurgically.  1.  Shock, postsurgical, evident by hypotension, tachycardia, hypoxia. Most likely this is due to  cardiogenic as a result of pulmonary embolism. The patient is very high risk for pulmonary embolism due to pelvic mass and history of not having good functional status for the last week. ABG and patient's presentation is also very suggestive of pulmonary embolism. As the patient is postsurgical today, we would not give any antithrombolytic medication at this time without confirmation. I would like to get CT for pulmonary angiogram stat, and for that, we need to have recent labs of creatinine which is ordered and will be collected very soon. Once her  status of pulmonary embolism is confirmed, we need to discuss it with the surgeon about further plan if it is positive.  2.  It might be also cardiogenic due to acute myocardial infarction in this situation. I ordered stat troponin and EKG, and we will monitor in critical care unit meanwhile.  3.   Hypoxia. This is mild, and the patient does not have any wheezing, so it again suggests  pulmonary embolism.  4.  Hypertension history. Currently, the patient is hypotensive and on norepinephrine drip, so we would like to just monitor in critical care unit setting and no medication for hypertension.  5.  Hypothyroidism. We will continue Synthroid when permissible later on.  6.  Blood loss anemia. Transfusion is done as per surgery protocol. Monitor CBC.   TOTAL CRITICAL CARE TIME SPENT IN THIS CONSULT: 60 minutes.   Condition, possibilities, and plan explained to patient's daughter, who is present in the room.   She claims herself to be power attorney of the patient, and I asked her about the patient's wishes, about CPR and as per her, the patient should be FULL CODE.   I spoke to the night hospitalist who is coming in about following up this case, and we will continue following in the night about further lab results further plan.    ____________________________ Ceasar Lund. Anselm Jungling, MD vgv:np D: 02/25/2013 24:23:53 ET T: 02/25/2013 21:56:11 ET JOB#: [id number]  cc: Ceasar Lund. Anselm Jungling, MD, <Dictator> Vaughan Basta MD ELECTRONICALLY SIGNED 03/25/2013 11:13

## 2014-11-27 NOTE — Consult Note (Signed)
Brief Consult Note: Diagnosis: hypotension, hypoxia.   Patient was seen by consultant.   Consult note dictated.   Recommend further assessment or treatment.   Orders entered.  Electronic Signatures: Vaughan Basta (MD)  (Signed 22-Jul-14 21:01)  Authored: Brief Consult Note   Last Updated: 22-Jul-14 21:01 by Vaughan Basta (MD)

## 2014-11-27 NOTE — H&P (Signed)
PATIENT NAME:  Rhonda Morse, Rhonda Morse MR#:  [id number] DATE OF BIRTH:  1931-03-29  DATE OF ADMISSION:  10/12/2012  PRIMARY CARE PHYSICIAN:  Einar Pheasant, MD  CHIEF COMPLAINT:  Shortness of breath.  HISTORY OF PRESENT ILLNESS:  The patient is an 81 year old white African American female with known history of COPD, hyperlipidemia, hypothyroidism who presented to the Emergency Department with complaints of shortness of breath but the last 4 to 5 days. The patient was wheezing diffusely and for that reason, the patient was brought to the Emergency Department on Wednesday.  The patient was given a breathing treatment and was discharged home with the prednisone, inhalers and her Bactrim.  Except for the inhalers, the patient had all the medications refilled including prednisone and Bactrim. However, the patient continued to have wheezing. This evening the patient was diffusely wheezing with severe respiratory distress. Concerned, this patient was brought to the Emergency Department. Work-up in the Emergency Department revealed a slightly elevated WBC count. Chest x-ray does not indicate any infiltrates. The patient received multiple breathing treatments in the Emergency Department and was given one dose of Levaquin.   PAST MEDICAL HISTORY:   1.  Arthritis.  2.  Shingles.  3.  Hypertension.  4.  Previous history of CVA.  5.  Hypothyroidism.   PAST SURGICAL HISTORY:  Hysterectomy.   ALLERGIES:  PENICILLIN.  HOME MEDICATIONS:   1.  Vytorin 40 mg one once a day.  2.  Prednisone 20 mg daily.  3.  Levothyroxine 50 mcg daily.  4.  Bactrim DS 1 tablet 2 times a day.  5.  Aspirin 81 mg daily.  6.  Combivent one dose of inhalation every 4 hours as needed.  7.  Advil as needed.   SOCIAL HISTORY:  Smoked heavily in the past, quit few years back.  Also drank alcohol in the past.  Currently lives by herself independent with activities of daily living.  FAMILY HISTORY:  History of hypertension.  REVIEW OF  SYSTEMS:   CONSTITUTIONAL:  Generalized weakness.  EYES:  No change in vision.  ENT:  Has mild sore throat. RESPIRATORY:  Has cough, shortness of breath.  GASTROINTESTINAL:  No nausea, vomiting or diarrhea.  GENITOURINARY:  No hematuria.  Increased urgency and frequency of urination.  CARDIOVASCULAR:  No chest pain, PND, orthopnea.  SKIN:  No rash or lesion. HEMATOLOGIC:  No easy bruising.  NEUROLOGIC:  Denies having any weakness or numbness in any part of the body.   PHYSICAL EXAMINATION:   GENERAL:  This is a well-built, well-nourished, age-appropriate female lying down in the bed, not in distress.  VITAL SIGNS:  Temperature 98.1, pulse 76, blood pressure and 170/94, respiratory rate of 16, oxygen saturation is 93% on room air.  HEENT:  Head normocephalic, atraumatic.  Anicteric sclerae.  Conjunctivae normal. Pupils equal and react to light. Mucous membranes moist.  NECK:  Supple. No lymphadenopathy. No JVD. No carotid bruit. CHEST:  No focal tenderness, bilateral diffuse wheezing.  HEART:  Heart S1 and S2 regular, no murmurs are heard.  ABDOMEN:  Bowel sounds present. Soft, nontender, nondistended.  EXTREMITIES:  No pedal edema. Pulses 2+. NEUROLOGIC:  The patient is alert, oriented to place, person and time. Cranial nerves II through XII intact. No motor and sensory deficits.   LABORATORY AND DIAGNOSTIC DATA:  The CMP shows a potassium 3.4, the rest of the values are within normal limits. CBC: WBC of 14.9, the rest of the values are within normal limits. Cardiac enzymes: CK  710, troponins are negative.   Chest x-ray, PA and lateral. No acute cardiopulmonary disease.   ASSESSMENT AND PLAN:  The patient is an 81 year old female comes to the Emergency Department who has failed outpatient treatment for chronic obstructive pulmonary disease exacerbation.   1.  Chronic obstructive pulmonary disease exacerbation.  Admit the patient as an inpatient, continue the breathing treatments, keep  the patient on Solu-Medrol and Levaquin.  2.  Hypertension. Currently moderately, controlled. We will continue to watch.  3.  Hypothyroidism. Continue Synthroid.  4.  Keep the patient on deep vein thrombosis prophylaxis with Lovenox.  5,  Hypokalemia.  We will replace by mouth.   Time spent:  45 minutes, reviewing old chart, updating the patient's family, as well as patient case.    ____________________________ Monica Becton, MD pv:ct D: 10/12/2012 03:42:15 ET T: 10/12/2012 08:24:42 ET JOB#: [id number]  cc: Monica Becton, MD, <Dictator> Einar Pheasant, MD Monica Becton MD ELECTRONICALLY SIGNED 10/15/2012 7:03

## 2014-11-27 NOTE — Consult Note (Signed)
   Comments   Dr Phifer and  I met with pt's daughter, sister, son-in-law, and ex-husband. Family updated. They were again informed of pt's very poor prognosis and that she may be approaching end of life. Daughter confirms having met with Dr Oliva Bustard and recognizes that patient has terminal cancer with no available treatment options due to her poor status. We talked about options for LTC placement vs hospice at home vs hospice home. We also again discussed code status. Daughter asked for some time to discuss this with her family.     Electronic Signatures for Addendum Section:  Phifer, Izora Gala (MD) (Signed Addendum 06-Aug-14 20:50)  Billey Chang, NP, and I met with pt's family. Agree with assessment and plan as outlined in above note.   Electronic Signatures: Aldina Porta, Kirt Boys (NP)  (Signed 06-Aug-14 13:52)  Authored: Palliative Care   Last Updated: 06-Aug-14 20:50 by Phifer, Izora Gala (MD)

## 2014-11-27 NOTE — Discharge Summary (Signed)
PATIENT NAME:  Rhonda Morse, Rhonda Morse MR#:  [id number] DATE OF BIRTH:  02-Feb-1931  DATE OF ADMISSION:  02/25/2013 DATE OF DISCHARGE:  03/14/2013  HISTORY OF PRESENT ILLNESS: This is an 81 year old female who had decline in health in the last couple of months and in the course of evaluation was found to have a large lower abdominal mass with elevated CA-125 suspicious for ovarian carcinoma. The patient also had a moderate amount of ascites associated with this. Her condition had recently declined significantly. She was pretty functional approximately 2 weeks prior to her surgery and over this period of time the patient had become less and less active. After full evaluation and consideration for management it was decided to proceed with a debulking laparotomy and insertion of a venous port followed by chemotherapy, and this was discussed with the patient in full preoperatively by oncology, by Dr. Sabra Heck, and by Dr. Jacquelyne Balint from GYN service. The patient was agreeable to this.  COURSE IN HOSPITAL: This patient was prepared for surgery and taken to the operating room on 02/25/2013. Her preoperative lab values showed a white count that was elevated at 18,400. It was felt that this was more than likely because of her ongoing abdominal carcinomatosis. Her basic chemistries were basically within normal limits at this time. At surgery the patient underwent initially insertion of venous access port followed by a laparotomy with the finding of a large pelvic mass which was extremely friable and bled very, very easily. The tumor was subsequently removed and this appeared to be pretty much arising from the left ovary, and she underwent bilateral oophorectomy.  A complete staging procedure was not performed. The patient did lose approximately 700 to 900 mL of blood which was replaced mostly with crystalloids and 2 units of blood. Postoperatively, the patient was noted to be somewhat hypotensive and hypoxic and accordingly  admitted to the ICU for management. Throughout the rest of her hospital stay, the patient exhibited significant hypoxia which required continued use of oxygen, and she had persistent elevated white count running in the 20,000 range for a long time. The patient was extubated on 07/22 after being for a few hours in the ICU and did not require reintubation. The patient remained extremely lethargic and almost completely sedated even without medications. Her fluids were carefully monitored to ensure that her renal functions were adequate, and the patient was seen in consultation by internal medicine from the hospitalist service and followed throughout the stay. The patient also was seen in consultation subsequently by palliative care in view of the very slow progress the patient was making and her known carcinomatosis. The patient was unable to tolerate any oral intake in fact refused to drink or eat anything for several days. Although her blood pressure had stabilized, she continued to have some mild tachycardia in the 100 range most of the time. Over gradual period of time her oxygen requirement decreased notably and she was maintained on about 2 liters of oxygen to 3 liters. She gradually became a little bit more alert, but still showed no energy level whatsoever to do anything more. She still refused to eat anything and drank very small quantities and sips of fluids. TPN was then initiated with the hope that this would provide for adequate nutrition. Because of persistent elevation of her white count, a blood culture was obtained which showed evidence of fungemia and accordingly the patient was treated with Diflucan. It did seem to help this to some degree. The source of  the infection was not clearly apparent. Her port however remained perfectly clean with no signs of redness, inflammation or other findings to suggest a problem. With the patient showing failure to thrive and essentially not making any progress it was  decided ultimately to obtain a hospice consultation. She was accepted for hospice management and decision was made to discharge her to hospice care.   FINAL DIAGNOSES: 1.  Carcinoma of the ovary with ascites. 2.  Blood loss secondary to surgery and anemia associated. 3.  Encephalopathy of multifactorial origin. 4.  Yeast septicemia.  5.  Malnutrition.  6.  History of hypothyroidism. 7.  History of chronic obstructive pulmonary disease.   PROCEDURES PERFORMED:  1.  Laparotomy, resection of left ovarian mass and excision of right ovary. 2.  Insertion of venous access port.  DISPOSITION: The patient is being discharged to hospice care and will also be followed by Dr. Oliva Bustard periodically to assess for any change in her status and whether chemotherapy would be of any consideration hereon. ____________________________ S.Robinette Haines, MD sgs:sb D: 03/31/2013 13:02:07 ET T: 03/31/2013 13:19:11 ET JOB#: [id number]  cc: S.G. Jamal Collin, MD, <Dictator> [facility] Robinette Haines MD ELECTRONICALLY SIGNED 04/06/2013 10:26

## 2014-11-27 NOTE — Consult Note (Signed)
   Comments   Met with pt's daughter. Updated her. She understands that pt is critically ill. Prognosis is guarded. We talked about use of HFNC if needed and risk for intubation. Daughter would be ok with intubation but recognizes that patient may not do well. We talked about possible decisions that family may have to make regarding her care. Right now daughter requests that patient remain a full code. All questions answered. Will follow.  updated.   Electronic Signatures for Addendum Section:  Phifer, Izora Gala (MD) (Signed Addendum 28-Jul-14 15:04)  Discussed with Billey Chang, NP, in detail. Agree with assessment and plan as outlined in above note.   Electronic Signatures: Borders, Kirt Boys (NP)  (Signed 28-Jul-14 13:43)  Authored: Palliative Care   Last Updated: 28-Jul-14 15:04 by Phifer, Izora Gala (MD)
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
## 2014-11-27 NOTE — Op Note (Signed)
PATIENT NAME:  Rhonda Morse, Rhonda Morse MR#:  [id number] DATE OF BIRTH:  05-07-1931  DATE OF PROCEDURE:  02/25/2013  CO-SURGEONS: Weber Cooks, MD, and Mckinley Jewel, MD  PREOPERATIVE DIAGNOSIS: Pelvic mass.   POSTOPERATIVE DIAGNOSIS: Ovarian malignancy.   PROCEDURES PERFORMED: 1.  Exploratory laparotomy.  2.  Bilateral salpingo-oophorectomy.  3.  Tumor reductive surgery.   ANESTHESIA: General.   COMPLICATIONS: None. Increased blood loss due to necrotic tumor.   INDICATION FOR SURGERY: Rhonda Morse is an 81 year old patient who presented with a mass filling the pelvis and after medical and cardiologic clearance was obtained, decision was made to proceed with surgery.   FINDINGS AT TIME OF SURGERY: Mainly necrotic large mass originating from the left ovary with signs of chronic bleeding. Moderate volume bloody ascites. Normal ovary on the right side. Tumor implants on colon and small bowel where the bowel was in contact with the tumor. Upper abdomen within normal limits. Omentum within normal limits.   Residual disease less than a centimeter.   Complete staging biopsies were not done due to the age of the patient and her medical problems.   OPERATIVE REPORT: After adequate general anesthesia had been obtained, the patient was prepped and draped in supine position. A midline incision was placed with a sharp knife and carried down through the fascia. The peritoneum was entered. Incision was extended cephalad and caudad. Exploration was performed with the above-mentioned findings. Cytology was taken. A Bookwalter retractor was placed.   Then using blunt and sharp dissection, the left ovarian tumor was freed from adhesions to the small bowel, sigmoid colon and left pelvic sidewall. The round ligament on the left side was stitch ligated and transected. The pelvic sidewall was entered. Vessels and ureter were identified. The infundibulopelvic ligament was clamped, cut and ligated twice using 0 Vicryl.  With further dissection, the tumor mass could be removed out of the pelvis. A clamp was placed around tube and utero-ovarian ligament so the mass could be removed completely. During this maneuver, the mass disintegrated due to the soft tissue and necrosis. The bowel was carefully inspected by Dr. Jamal Morse and a serosal injury was closed with interrupted 3-0 silk sutures. Biopsies were taken from the colon and from the small bowel mesentery. In view of the patient's brittle medical status, decision was then made not to proceed with any further surgery. Irrigation was performed. There were several oozing vessels in the pelvis, which were cauterized. Then Flowseal was placed, as well as pressure was put on the lesion for a while; thus, adequate hemostasis could be confirmed.   Lap sponges and retractors were removed. The fascia was closed with interrupted 2-0 PDS sutures. Adequate hemostasis after all instruments and laps were removed. Irrigation of the subcutaneous tissue was done before it was reapproximated with interrupted 2-0 Vicryl stitches, and staples were used to close the skin.   The patient was taken to recovery room in critical condition. Lap sponges and instrument counts were correct x 2. The postoperative urine was clear; however, urine output was low.    ____________________________ Weber Cooks, MD bem:jm D: 02/25/2013 17:02:19 ET T: 02/25/2013 19:44:35 ET JOB#: [id number]  cc: Weber Cooks, MD, <Dictator> Weber Cooks MD ELECTRONICALLY SIGNED 02/26/2013 15:19

## 2015-04-04 IMAGING — CT CT ABD-PELV W/ CM
1 of 3 series · 13 of 32 positions shown, 19 images · non-contrast
Comparison: none

REASON FOR EXAM: Wt loss decreased appetite
COMMENTS:

PROCEDURE:     KCT - KCT ABDOMEN/PELVIS W  - January 14, 2013 [DATE]
RESULT:     History: Weight loss.
Comparison Study: Prior chest CT of 10/09/2012.

[Series 5: abd 3mm w 3.0 i40f 3 · axial · 0.70mm/px · z∈[-472,-133]mm · 13 of 133 slices shown, 19 images]
[im 10/133  soft-tissue]
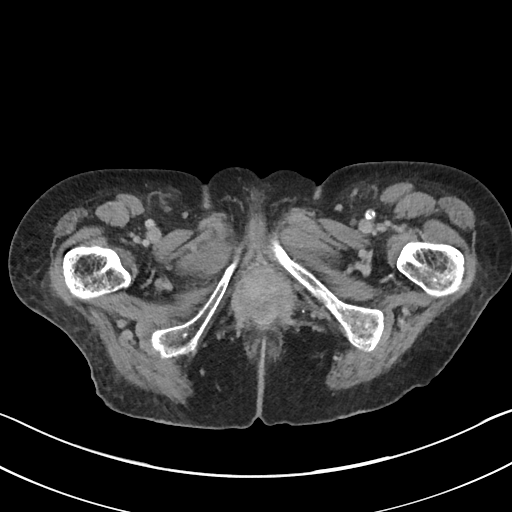
[im 10/133  bone]
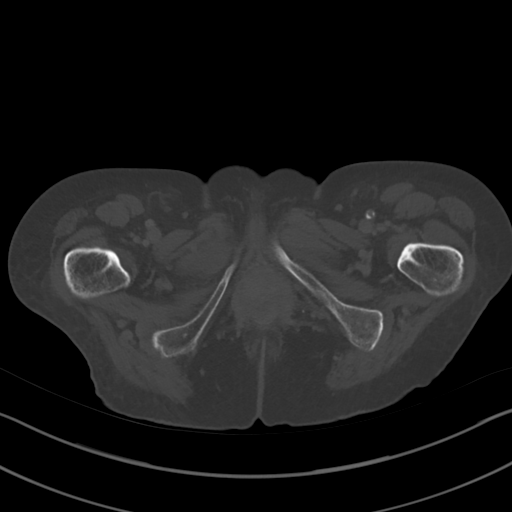
[im 19/133  soft-tissue]
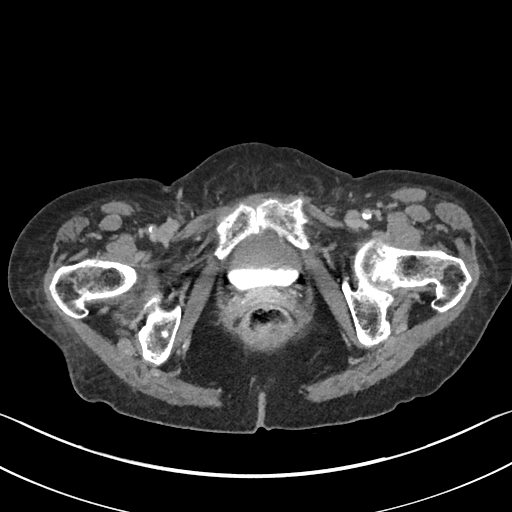
[im 29/133  soft-tissue]
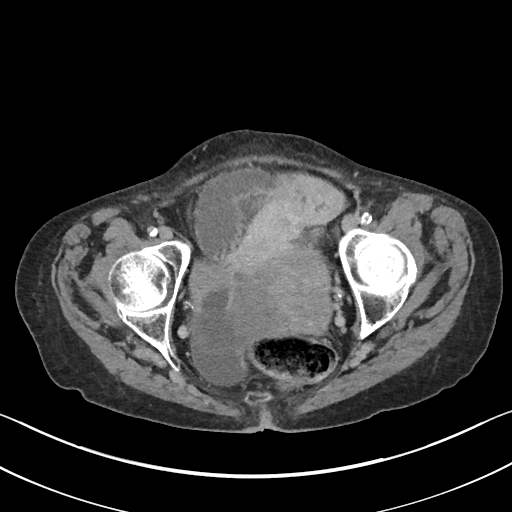
[im 38/133  soft-tissue]
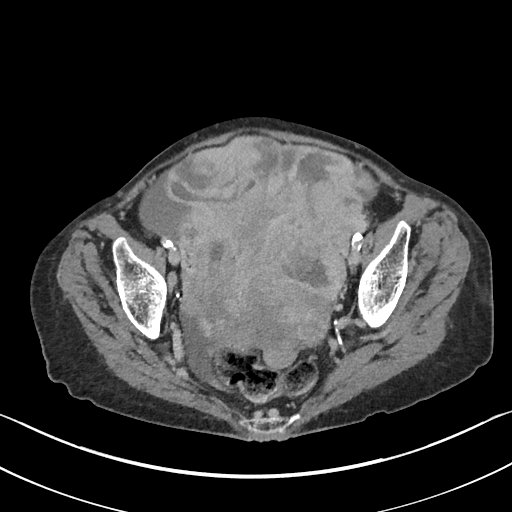
[im 48/133  soft-tissue]
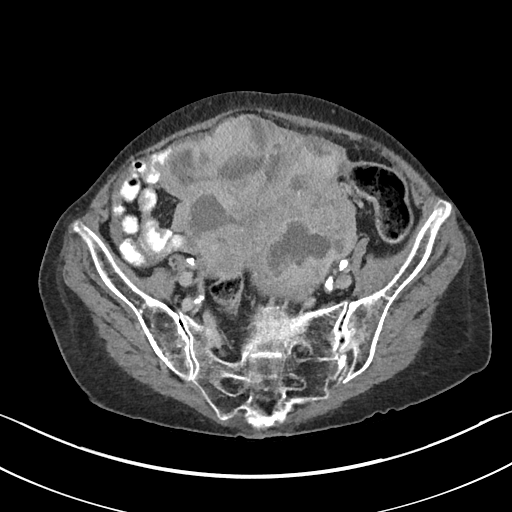
[im 57/133  soft-tissue]
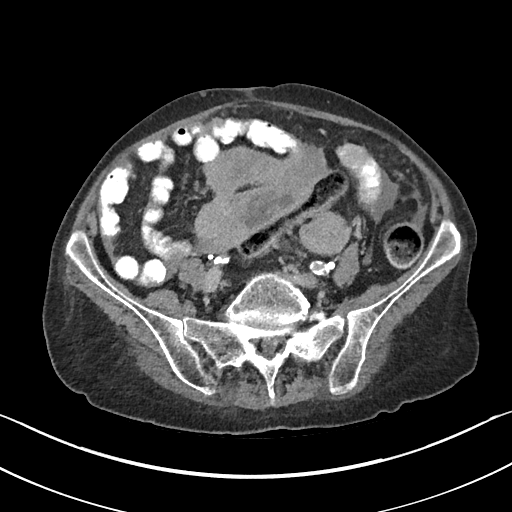
[im 67/133  soft-tissue]
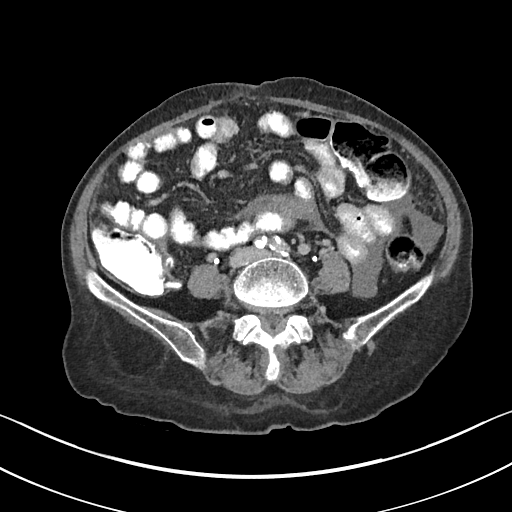
[im 76/133  soft-tissue]
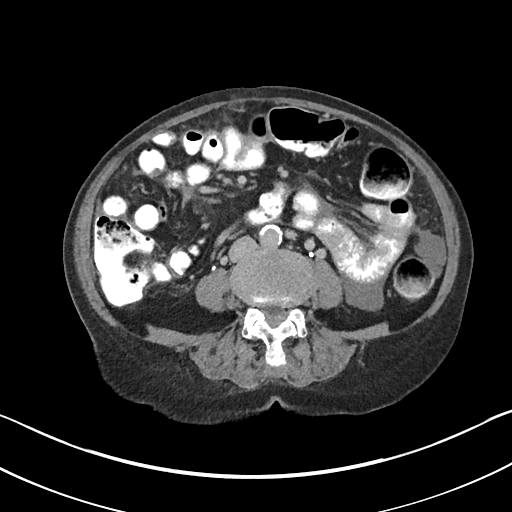
[im 85/133  soft-tissue]
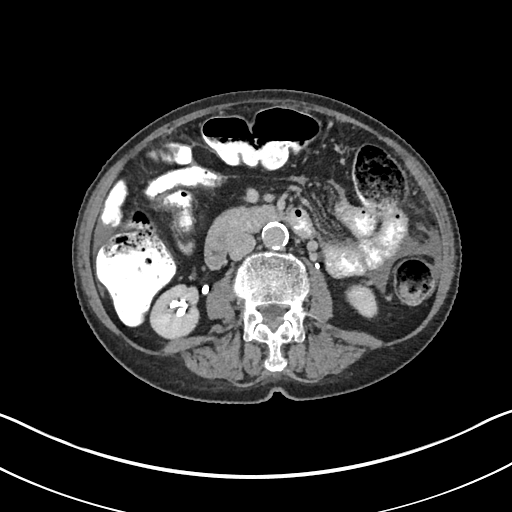
[im 85/133  bone]
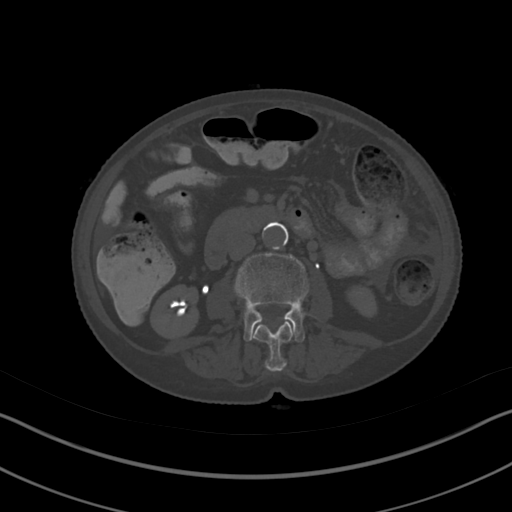
[im 95/133  soft-tissue]
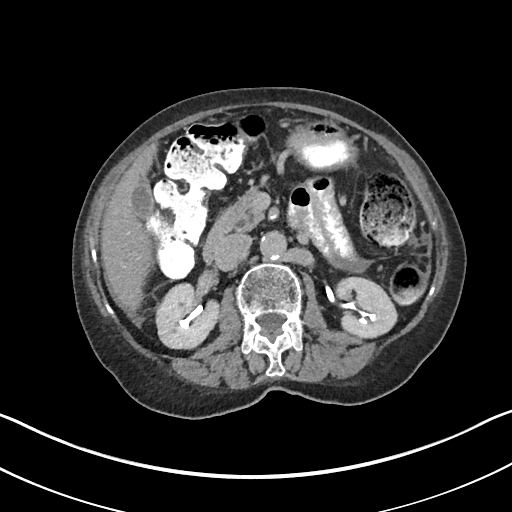
[im 95/133  lung]
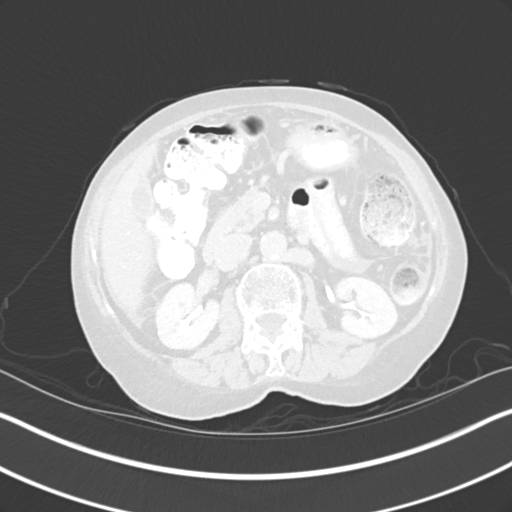
[im 104/133  soft-tissue]
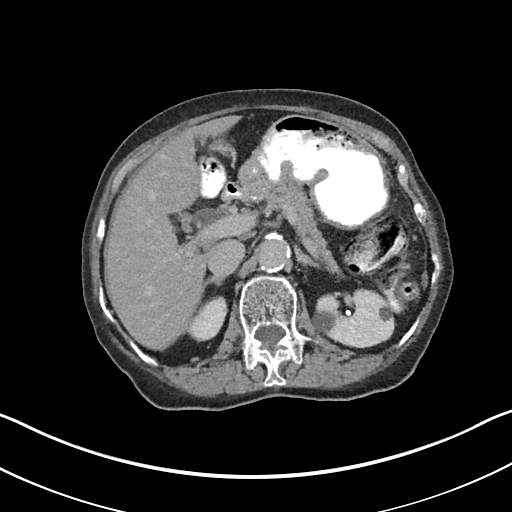
[im 104/133  lung]
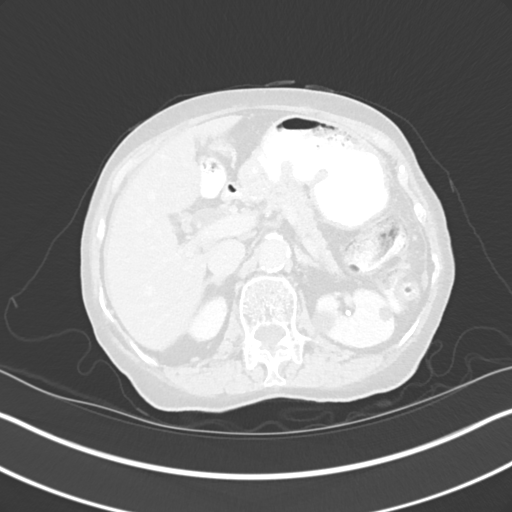
[im 114/133  soft-tissue]
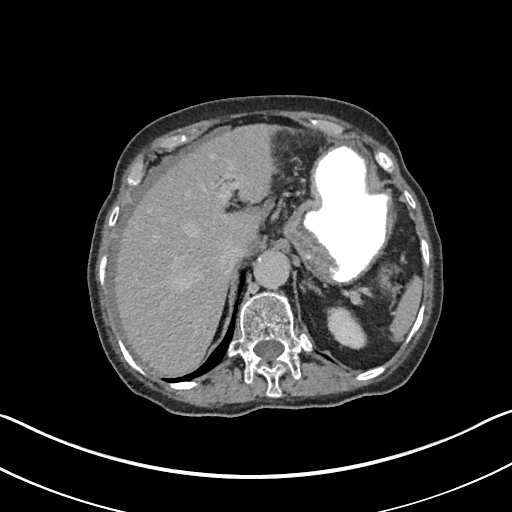
[im 114/133  lung]
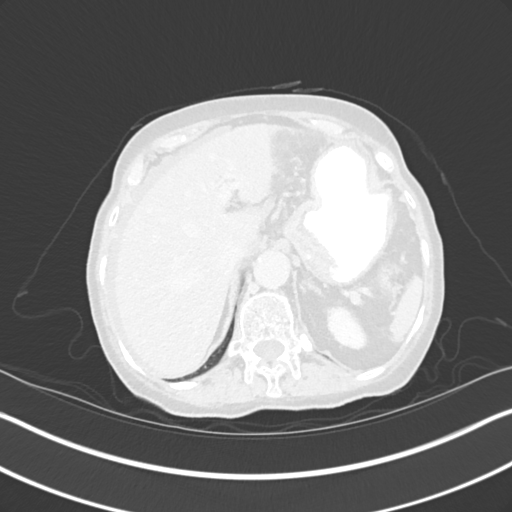
[im 123/133  soft-tissue]
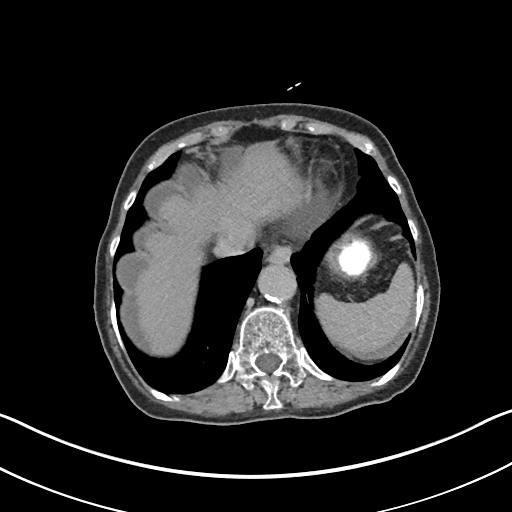
[im 123/133  lung]
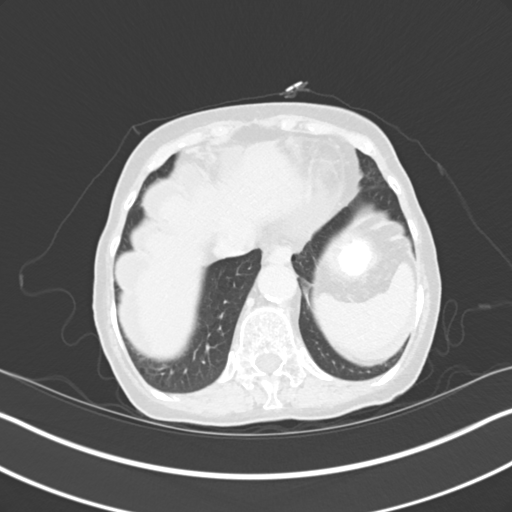

[13 of 32 positions shown; findings below may reference images not displayed]

FINDINGS: Standard CT obtained 85 cc of 2sovue-[id number]. Liver is normal. Portal
veins patent. Hepatic veins patent. Spleen is normal. Splenic vein is
patent. Gallbladder nondistended. Common bile duct is borderline enlarged at
9 mm. No obstructing lesion noted. Subtle low density approxi- 1 cm lesion
is noted within or adjacent to the anterior aspect of the a pancreas at the
junction of the head in the body. This is seen best on image #24. Several
small peripancreatic lymph nodes cannot be excluded. Small periportal lymph
nodes are also present. The largest appears to measure 1.3 cm on image #29.
Adrenals are normal. Left renal cyst are present. No hydronephrosis or
hydroureter. A 17 cm transverse diameter x 14.9 cm AP diameter 20 cm in
superior to inferior diameter  ill-defined enhancing pelvic mass is present.
This could be a malignancy of uterine/cervical origin or ovarian origin .
Ascites present. No bowel obstruction or free air. No acute or focal bony
lesions noted. Lung bases clear.
IMPRESSION: 1. Extremely large enhancing mass occupying the entire pelvis most likely of
uterine/cervical or ovarian malignancy.
2. Ascites.
3. Peripancreatic and periportal lymph nodes. A small pancreatic lesion/mass
cannot be excluded.

Consultation with gynecologic oncology suggested.
